# Patient Record
Sex: Female | Born: 1970 | Race: Asian | Hispanic: No | Marital: Single | Smoking: Never smoker
Health system: Southern US, Community
[De-identification: ages and names within clinical notes are randomized; demographics above are authoritative.]

## PROBLEM LIST (undated history)

## (undated) DIAGNOSIS — R21 Rash and other nonspecific skin eruption: Secondary | ICD-10-CM

## (undated) DIAGNOSIS — T7840XA Allergy, unspecified, initial encounter: Secondary | ICD-10-CM

## (undated) DIAGNOSIS — Z789 Other specified health status: Secondary | ICD-10-CM

## (undated) DIAGNOSIS — E785 Hyperlipidemia, unspecified: Secondary | ICD-10-CM

## (undated) DIAGNOSIS — U071 COVID-19: Secondary | ICD-10-CM

## (undated) HISTORY — DX: Other specified health status: Z78.9

## (undated) HISTORY — DX: Rash and other nonspecific skin eruption: R21

## (undated) HISTORY — DX: Hyperlipidemia, unspecified: E78.5

## (undated) HISTORY — DX: COVID-19: U07.1

## (undated) HISTORY — PX: TUBAL LIGATION: SHX77

## (undated) HISTORY — DX: Allergy, unspecified, initial encounter: T78.40XA

## (undated) HISTORY — PX: OOPHORECTOMY: SHX86

---

## 2018-01-09 ENCOUNTER — Ambulatory Visit: Payer: Medicaid Other | Admitting: Obstetrics and Gynecology

## 2018-01-09 ENCOUNTER — Encounter: Payer: Self-pay | Admitting: Obstetrics and Gynecology

## 2018-01-09 VITALS — BP 103/55 | HR 72 | Wt 127.0 lb

## 2018-01-09 DIAGNOSIS — N898 Other specified noninflammatory disorders of vagina: Secondary | ICD-10-CM | POA: Diagnosis not present

## 2018-01-09 DIAGNOSIS — R21 Rash and other nonspecific skin eruption: Secondary | ICD-10-CM | POA: Diagnosis not present

## 2018-01-09 DIAGNOSIS — B3731 Acute candidiasis of vulva and vagina: Secondary | ICD-10-CM

## 2018-01-09 DIAGNOSIS — B373 Candidiasis of vulva and vagina: Secondary | ICD-10-CM

## 2018-01-09 MED ORDER — MICONAZOLE NITRATE 4 % VA CREA: TOPICAL_CREAM | VAGINAL | 2 refills | Status: DC

## 2018-01-09 MED ORDER — FLUCONAZOLE 150 MG PO TABS: ORAL_TABLET | ORAL | 0 refills | Status: DC

## 2018-01-09 NOTE — Progress Notes (Signed)
Saw provider at [facility] couple wks ago. Treated for BV which is better. Also given Nystatin for vaginal itching which helped a few days and then got worse. Now no vag d/c. Was tested for sti and all negative. Having a lot of vag itching and perineal itching and dryness

## 2018-01-09 NOTE — Patient Instructions (Signed)

## 2018-01-09 NOTE — Progress Notes (Signed)
   GYNECOLOGY OFFICE VISIT NOTE  History:  47 y.o. No obstetric history on file. here today for concern about vaginal itching. Patient states she has had vaginal dryness and itching for several weeks. Has associated rash with it. States she went to go see her PCP for concernc and was dfiangosed with BV which was treated. Also given Nystain for itching but aptietn discontinued because she states it didn't work for symtpom relief. Patient states she gets releif from using cocnut oil. She has stopepd shaving and waxing the area. Tried steriods without improvement. Physician alst tried given hydroxyzine for itching but it is not helping. Ithcing and discomfort worse when wet. Referred for consultation.  She denies any abnormal vaginal discharge, bleeding, pelvic pain or other concerns.   History reviewed. No pertinent past medical history.  History reviewed. No pertinent surgical history.  The following portions of the patient's history were reviewed and updated as appropriate: allergies, current medications, past family history, past medical history, past social history, past surgical history and problem list.   Health Maintenance:  Needs annual wellness to include pap and breast exam. Needs mammography.   Review of Systems:  Pertinent items noted in HPI.   Objective:  Physical Exam BP (!) 103/55   Pulse 72   Wt 57.6 kg (127 lb)  CONSTITUTIONAL: Well-developed, well-nourished female in no acute distress.  NEUROLOGIC: Alert and oriented. PSYCHIATRIC: Normal mood and affect.  CARDIOVASCULAR: Normal heart rate noted RESPIRATORY: Effort normal, no problems with respiration noted ABDOMEN: Soft, non-tender. no distention noted.   PELVIC: deep red rash with scaling over entire vulva area and into rectum. Excoriations appreciated. Appears thickened and lichenified. No signs of superimposed infection MUSCULOSKELETAL: Normal range of motion. No edema noted.  Labs and Imaging No results  found.  Assessment & Plan:  1. Rash of vulva Most consistent with vulvovaginal candidiasis ina appearance and symptomatology. Will treat with oral fluconazole and topical miconazole. Continue Atarax for itching. Patient to return in 2 weeks for follow-up on rash. Alternate diagnosis could be lichen sclerosis but less likely.   Routine preventative health maintenance measures emphasized. Patient to schedule seperate annual visit.  Please refer to After Visit Summary for other counseling recommendations.   Return in about 2 weeks (around 01/23/2018) for gyn follow-up.  Caryl Ada, DO OB Fellow Center for [facility], [facility]

## 2018-01-10 ENCOUNTER — Encounter: Payer: Self-pay | Admitting: Advanced Practice Midwife

## 2018-01-16 ENCOUNTER — Other Ambulatory Visit (HOSPITAL_COMMUNITY)
Admission: RE | Admit: 2018-01-16 | Discharge: 2018-01-16 | Disposition: A | Discharge status: Home / Self Care | Payer: Medicaid Other | Source: Ambulatory Visit | Attending: Family Medicine | Admitting: Family Medicine

## 2018-01-16 ENCOUNTER — Ambulatory Visit: Payer: Medicaid Other | Admitting: Advanced Practice Midwife

## 2018-01-16 ENCOUNTER — Encounter: Payer: Self-pay | Admitting: Advanced Practice Midwife

## 2018-01-16 VITALS — BP 98/72 | HR 86 | Wt 128.6 lb

## 2018-01-16 DIAGNOSIS — L308 Other specified dermatitis: Secondary | ICD-10-CM | POA: Insufficient documentation

## 2018-01-16 DIAGNOSIS — L292 Pruritus vulvae: Secondary | ICD-10-CM | POA: Insufficient documentation

## 2018-01-16 DIAGNOSIS — N9089 Other specified noninflammatory disorders of vulva and perineum: Secondary | ICD-10-CM | POA: Diagnosis present

## 2018-01-16 DIAGNOSIS — R21 Rash and other nonspecific skin eruption: Secondary | ICD-10-CM

## 2018-01-16 DIAGNOSIS — N898 Other specified noninflammatory disorders of vagina: Secondary | ICD-10-CM

## 2018-01-16 MED ORDER — CLOBETASOL PROP EMOLLIENT BASE 0.05 % EX CREA: 1.0000 "application " | TOPICAL_CREAM | CUTANEOUS | 5 refills | Status: DC

## 2018-01-16 NOTE — Progress Notes (Signed)
Procedure: Asked to see patient by Sharen Counter, CNM. There is diffuse lichenification noted of the labia majora/peri-rectal area and vulva with labia minora sparing. Area is pink/gray with scaling. New areas are cropping up. S/p multiple failed treatments for yeast. Patient identified, informed consent signed, copy in chart, time out performed.    Area cleansed with Alcohol.  Injected with 1% Lidocaine with Epi.  2 mL. Area cleaned with Betadine and 4 mm punch biopsy performed without difficulty.  Hemostasis obtained with Silver Nitrate.  Patient tolerated procedure well.   Patient given post procedure instructions.   Reva Bores, MD 01/16/2018 5:48 PM

## 2018-01-16 NOTE — Progress Notes (Signed)
  GYNECOLOGY PROGRESS NOTE  History:  47 y.o. presents to [facility] office today for problem gyn visit. She reports worsening vaginal symptoms including itching, skin flaking and dryness, and discoloration of the vaginal area.  She reports symptoms started ~1 month ago.  She is taking Diflucan 150 mg Q 3 days x 3 doses and using topical Monistat daily for her symptoms but there is worsening instead of improvement.  She has never had these symptoms before. There are no other associated symptoms.  She denies h/a, dizziness, shortness of breath, n/v, or fever/chills.    The following portions of the patient's history were reviewed and updated as appropriate: allergies, current medications, past family history, past medical history, past social history, past surgical history and problem list.   Review of Systems:  Pertinent items are noted in HPI.   Objective:  Physical Exam Blood pressure 98/72, pulse 86, weight 128 lb 9.6 oz (58.3 kg). VS reviewed, nursing note reviewed,  Constitutional: well developed, well nourished, no distress HEENT: normocephalic CV: normal rate Pulm/chest wall: normal effort Breast Exam: deferred Abdomen: soft Neuro: alert and oriented x 3 Skin: warm, dry Psych: affect normal Pelvic exam: On visual inspection, bilateral labia, perineum, and rectal skin areas are pale/gray, flaky with mild edema, erythema at site and with small satellite lesions surrounding, some small open skin areas (scratching?) throughout    Assessment & Plan:  1. Vaginal itching --Consult Dr Shawnie Pons regarding appearance and presentation.   --Biopsy by Dr Shawnie Pons, see separate note - Surgical pathology  2. Rash of vulva  - Clobetasol Prop Emollient Base (CLOBETASOL PROPIONATE E) 0.05 % emollient cream; Apply 1 application topically 1 day or 1 dose. Use tid x 3-4 wks, then drop to bid x 3-4 wks, then drop to daily x 3-4 wks, then drop to qod  Dispense: 60 g; Refill: 5   Sharen Counter,  CNM 5:49 PM

## 2018-01-16 NOTE — Patient Instructions (Signed)
Vulva Biopsy, Care After These instructions give you information about caring for yourself after your procedure. Your doctor may also give you more specific instructions. Call your doctor if you have any problems or questions after your procedure. Follow these instructions at home: Biopsy Site Care   Do not rub the biopsy area after peeing (urinating). Gently: ? Pat the area dry. Or, use a bottle filled with warm water (peri-bottle) to clean the area. ? Wipe from front to back.  Follow instructions from your doctor about how to take care of your biopsy site. Make sure you: ? Clean the area using water and mild soap twice a day or as told by your doctor. Gently pat the area dry. ? If you were prescribed an antibiotic medical ointment, apply it as told by your doctor. Do not stop using the antibiotic even if your condition gets better. ? Take a warm water bath that is taken while you are sitting down (sitz bath). Do this as needed to help with pain. ? Leave stitches (sutures), skin glue, or skin tape (adhesive) strips in place. They may need to stay in place for 2 weeks or longer. If tape strips get loose and curl up, you may trim the loose edges. Do not remove tape strips completely unless your doctor says it is okay.  Check your biopsy site every day for signs of infection. Check for: ? More redness, swelling, or pain. ? More fluid or blood. ? Warmth. ? Pus or a bad smell. Lifestyle  Wear loose, cotton underwear.  Do not wear tight pants.  Do not use a tampon, douche, or put anything in your vagina for at least one week or until your doctor says it is okay.  Do not have sex for at least one week or until your doctor says it is okay.  Do not exercise until your doctor says it is okay.  Do not take baths, swim, or use a hot tub until your doctor says it is okay. General instructions  Take over-the-counter and prescription medicines only as told by your doctor.  Use a sanitary pad  until bleeding stops.  Keep all follow-up visits as told by your doctor. This is important.  If the sample is being sent for testing, it is your responsibility to get the results of your procedure. Ask your doctor or the department doing the procedure when your results will be ready. Contact a doctor if:  You have more redness, swelling, or pain around your biopsy site.  You have more fluid or blood coming from your biopsy site.  Your biopsy site feels warm when you touch it.  Medicine does not help your pain. Get help right away if:  You have a lot of bleeding from the vulva.  You have pus or a bad smell coming from your biopsy site.  You have a fever.  You have lower belly pain. This information is not intended to replace advice given to you by your health care provider. Make sure you discuss any questions you have with your health care provider. Document Released: 11/26/2008 Document Revised: 02/05/2016 Document Reviewed: 07/21/2015 Elsevier Interactive Patient Education  2018 Elsevier Inc.  

## 2018-01-16 NOTE — Progress Notes (Signed)
Was seen last wk in clinic for vaginal yeast infection and rash on perineum. Treated for yeast and taking Keflex but not any better-poss worse. Unable to sleep and very uncomfortable

## 2018-01-17 ENCOUNTER — Encounter: Payer: Self-pay | Admitting: *Deleted

## 2018-01-19 ENCOUNTER — Encounter (HOSPITAL_COMMUNITY): Payer: Self-pay | Admitting: *Deleted

## 2018-01-19 ENCOUNTER — Inpatient Hospital Stay (HOSPITAL_COMMUNITY)
Admission: AD | Admit: 2018-01-19 | Discharge: 2018-01-19 | Disposition: A | Discharge status: Home / Self Care | Payer: Medicaid Other | Source: Ambulatory Visit | Attending: Obstetrics & Gynecology | Admitting: Obstetrics & Gynecology

## 2018-01-19 ENCOUNTER — Other Ambulatory Visit: Payer: Self-pay | Admitting: Advanced Practice Midwife

## 2018-01-19 DIAGNOSIS — T8149XA Infection following a procedure, other surgical site, initial encounter: Secondary | ICD-10-CM

## 2018-01-19 DIAGNOSIS — N898 Other specified noninflammatory disorders of vagina: Secondary | ICD-10-CM | POA: Insufficient documentation

## 2018-01-19 DIAGNOSIS — Z79899 Other long term (current) drug therapy: Secondary | ICD-10-CM | POA: Diagnosis not present

## 2018-01-19 DIAGNOSIS — R102 Pelvic and perineal pain: Secondary | ICD-10-CM | POA: Insufficient documentation

## 2018-01-19 DIAGNOSIS — L292 Pruritus vulvae: Secondary | ICD-10-CM

## 2018-01-19 MED ORDER — COCONUT OIL OIL
1.0000 "application " | TOPICAL_OIL | Status: DC | PRN
  Administered 2018-01-19: 1 via TOPICAL
  Filled 2018-01-19 (×2): qty 120

## 2018-01-19 MED ORDER — SULFAMETHOXAZOLE-TRIMETHOPRIM 800-160 MG PO TABS: 1.0000 | ORAL_TABLET | Freq: Two times a day (BID) | ORAL | 0 refills | Status: AC

## 2018-01-19 MED ORDER — NAPROXEN 500 MG PO TABS: 500.0000 mg | ORAL_TABLET | Freq: Two times a day (BID) | ORAL | 0 refills | Status: DC

## 2018-01-19 NOTE — MAU Provider Note (Signed)
Chief Complaint: Vaginal Pain   First Provider Initiated Contact with Patient 01/19/18 1141      SUBJECTIVE HPI: Elaine Hawkins is a 47 y.o. G2P2002 who presents to maternity admissions reporting worsening vaginal symptoms with vaginal pain.  She initially presented on 4/29 to [facility] office and was treated for yeast infection.  On 01/16/18 she presented with worsening vaginal itching, and some discoloration and peeling/dryness of the vagina. A vaginal punch biopsy was taken at that time by Dr Shawnie Pons and she was started on topical steroid ointment, clobetasol TID PRN.  She reports the vulva, perineum, and rectal area are now painful, like pins and needles, and the are of the biopsy is very painful and draining yellow liquid.  She has not tried any other treatments. There are no other associated symptoms.   She denies vaginal bleeding, vaginal itching/burning, urinary symptoms, h/a, dizziness, n/v, or fever/chills.     HPI  Past Medical History:  Diagnosis Date  . Rash    Past Surgical History:  Procedure Laterality Date  . CESAREAN SECTION    . OOPHORECTOMY     L ovary   Social History   Socioeconomic History  . Marital status: Single    Spouse name: Not on file  . Number of children: Not on file  . Years of education: Not on file  . Highest education level: Not on file  Occupational History  . Not on file  Social Needs  . Financial resource strain: Not on file  . Food insecurity:    Worry: Not on file    Inability: Not on file  . Transportation needs:    Medical: Not on file    Non-medical: Not on file  Tobacco Use  . Smoking status: Never Smoker  . Smokeless tobacco: Never Used  Substance and Sexual Activity  . Alcohol use: Never    Frequency: Never  . Drug use: Never  . Sexual activity: Yes  Lifestyle  . Physical activity:    Days per week: Not on file    Minutes per session: Not on file  . Stress: Not on file  Relationships  . Social connections:    Talks on phone:  Not on file    Gets together: Not on file    Attends religious service: Not on file    Active member of club or organization: Not on file    Attends meetings of clubs or organizations: Not on file    Relationship status: Not on file  . Intimate partner violence:    Fear of current or ex partner: Not on file    Emotionally abused: Not on file    Physically abused: Not on file    Forced sexual activity: Not on file  Other Topics Concern  . Not on file  Social History Narrative  . Not on file   No current facility-administered medications on file prior to encounter.    Current Outpatient Medications on File Prior to Encounter  Medication Sig Dispense Refill  . Clobetasol Prop Emollient Base (CLOBETASOL PROPIONATE E) 0.05 % emollient cream Apply 1 application topically 1 day or 1 dose. Use tid x 3-4 wks, then drop to bid x 3-4 wks, then drop to daily x 3-4 wks, then drop to qod 60 g 5  . fluconazole (DIFLUCAN) 150 MG tablet Take one tablet. Wait 3 days then take second dose. Wait another 3 days and take final third dose. 3 tablet 0   No Known Allergies  ROS:  Review of Systems  Constitutional: Negative for chills, fatigue and fever.  Respiratory: Negative for shortness of breath.   Cardiovascular: Negative for chest pain.  Genitourinary: Positive for vaginal pain. Negative for difficulty urinating, dysuria, flank pain, pelvic pain, vaginal bleeding and vaginal discharge.  Neurological: Negative for dizziness and headaches.  Psychiatric/Behavioral: Negative.      I have reviewed patient's Past Medical Hx, Surgical Hx, Family Hx, Social Hx, medications and allergies.   Physical Exam   Patient Vitals for the past 24 hrs:  BP Temp Temp src Pulse Resp SpO2 Height Weight  01/19/18 1404 116/70 97.9 F (36.6 C) Oral 72 16 - - -  01/19/18 1110 108/72 98.3 F (36.8 C) Oral 83 18 100 %  (1.549 m) 126 lb (57.2 kg)   Constitutional: Well-developed, well-nourished female in no acute  distress.  Cardiovascular: normal rate Respiratory: normal effort GI: Abd soft, non-tender. Pos BS x 4 MS: Extremities nontender, no edema, normal ROM Neurologic: Alert and oriented x 4.  GU: Neg CVAT.  PELVIC EXAM: On visual inspection, bilateral labia, perineum, and rectal area with mild erythema and peeling skin in patches    LAB RESULTS No results found for this or any previous visit (from the past 24 hour(s)).     IMAGING No results found.  MAU Management/MDM: On visual inspection, there is improvement over my exam on 5/6 in the office.  There is more dryness and peeling, possibly healing from previous boggy/edemetous tissue.  Pins and needles sensation may also be healing.  Biopsy wound does have some edema surrounding it and some yellow discharge.  Dr Macon Large to bedside to evaluate pt.  Continue clobetasol as prescribed, may reduce to BID if preferred.  Topical coconut oil obtained for pt to apply for comfort. Use as often as needed PRN in between medication doses.  Start Bactrim DS BID x 7 days for possible wound/surgical site infection.  Naproxen 500 mg BID PRN for pain. F/U in office as scheduled. Pt discharged with strict return precautions.  ASSESSMENT 1. Wound, surgical, infected   2. Vulvar itching     PLAN Discharge home Allergies as of 01/19/2018   No Known Allergies     Medication List    STOP taking these medications   cephALEXin 500 MG capsule Commonly known as:  KEFLEX   MICONAZOLE NITRATE VAGINAL 4 % Crea     TAKE these medications   Clobetasol Prop Emollient Base 0.05 % emollient cream Commonly known as:  CLOBETASOL PROPIONATE E Apply 1 application topically 1 day or 1 dose. Use tid x 3-4 wks, then drop to bid x 3-4 wks, then drop to daily x 3-4 wks, then drop to qod   fluconazole 150 MG tablet Commonly known as:  DIFLUCAN Take one tablet. Wait 3 days then take second dose. Wait another 3 days and take final third dose.    sulfamethoxazole-trimethoprim 800-160 MG tablet Commonly known as:  BACTRIM DS,SEPTRA DS Take 1 tablet by mouth 2 (two) times daily for 7 days.      Follow-up Information    Center for [facility] Follow up.   Specialty:  Obstetrics and Gynecology Why:  As scheduled, return to MAU as needed for emergencies Contact information: [address] [place] [address] [phone]          Sharen Counter Certified Nurse-Midwife 01/19/2018  6:15 PM

## 2018-01-19 NOTE — MAU Note (Signed)
Pt reports vaginal irritation, was seen and given cream and referred to gyn clinic and given diflucan, another cream and they did a biopsy. States the symptoms are worsening.

## 2018-01-19 NOTE — Discharge Instructions (Signed)
Use topical steroid as prescribed plus use coconut oil for hydration in between uses of medication.

## 2018-01-19 NOTE — Progress Notes (Signed)
See complete MAU  note

## 2018-01-21 LAB — HERPES SIMPLEX VIRUS(HSV) DNA BY PCR
HSV 1 DNA: NEGATIVE
HSV 2 DNA: NEGATIVE

## 2018-01-24 ENCOUNTER — Ambulatory Visit (INDEPENDENT_AMBULATORY_CARE_PROVIDER_SITE_OTHER): Payer: Medicaid Other | Admitting: Obstetrics and Gynecology

## 2018-01-24 ENCOUNTER — Encounter: Payer: Self-pay | Admitting: Obstetrics and Gynecology

## 2018-01-24 VITALS — BP 110/65 | HR 86 | Wt 130.1 lb

## 2018-01-24 DIAGNOSIS — L292 Pruritus vulvae: Secondary | ICD-10-CM | POA: Diagnosis not present

## 2018-01-25 ENCOUNTER — Other Ambulatory Visit: Payer: Self-pay | Admitting: Family Medicine

## 2018-01-25 DIAGNOSIS — L292 Pruritus vulvae: Secondary | ICD-10-CM

## 2018-01-26 ENCOUNTER — Other Ambulatory Visit (HOSPITAL_COMMUNITY): Payer: Self-pay | Admitting: Family

## 2018-01-26 DIAGNOSIS — Z1231 Encounter for screening mammogram for malignant neoplasm of breast: Secondary | ICD-10-CM

## 2018-01-27 ENCOUNTER — Ambulatory Visit
Admission: RE | Admit: 2018-01-27 | Discharge: 2018-01-27 | Disposition: A | Discharge status: Home / Self Care | Payer: Medicaid Other | Source: Ambulatory Visit | Attending: Family | Admitting: Family

## 2018-01-27 ENCOUNTER — Encounter: Payer: Self-pay | Admitting: Obstetrics and Gynecology

## 2018-01-27 DIAGNOSIS — Z1231 Encounter for screening mammogram for malignant neoplasm of breast: Secondary | ICD-10-CM

## 2018-01-27 NOTE — Progress Notes (Signed)
   GYNECOLOGY OFFICE VISIT NOTE  History:  47 y.o. No obstetric history on file. here today for follow-up on vulvar rash. Patient states after last appointment with me she had to follow-up twice once being in MAU. Stated the treatment for yeast did not work. Came in to clinic and had biopsy of area and was given steroid cream to use TID. The cream made her dry and was painful so she went to MAU. While in the MAU noted that biopsy site was infected. Was given Rx for antibiotics for which she is completing. States that now that she has reduced the frequency of the clobetasol ointment to daily the rash is much better. Itching has improved. Coloration also improving. Feels much better. Wondering what her biopsy results showed. She denies any abnormal vaginal discharge, bleeding, pelvic pain, dyusira or other concerns.   Past Medical History:  Diagnosis Date  . Rash     Past Surgical History:  Procedure Laterality Date  . CESAREAN SECTION    . OOPHORECTOMY     L ovary   Current Outpatient Medications on File Prior to Visit  Medication Sig Dispense Refill  . Clobetasol Prop Emollient Base (CLOBETASOL PROPIONATE E) 0.05 % emollient cream Apply 1 application topically 1 day or 1 dose. Use tid x 3-4 wks, then drop to bid x 3-4 wks, then drop to daily x 3-4 wks, then drop to qod 60 g 5  . fluconazole (DIFLUCAN) 150 MG tablet Take one tablet. Wait 3 days then take second dose. Wait another 3 days and take final third dose. 3 tablet 0  . naproxen (NAPROSYN) 500 MG tablet Take 1 tablet (500 mg total) by mouth 2 (two) times daily with a meal. 30 tablet 0   No current facility-administered medications on file prior to visit.     Review of Systems:  Pertinent items noted in HPI.   Objective:  Physical Exam BP 110/65   Pulse 86   Wt 130 lb 1.6 oz (59 kg)   LMP 12/29/2017   BMI 24.58 kg/m  CONSTITUTIONAL: Well-developed, well-nourished female in no acute distress.  NEUROLOGIC: Alert and  oriented. PSYCHIATRIC: Normal mood and affect.  CARDIOVASCULAR: Normal heart rate noted RESPIRATORY: Effort normal, no problems with respiration noted PELVIC: on visual inspection bilateral labia, perineum, and rectum with thickened pink skin. Improved lichenification. No overlying scales. Biopsy site clean without drainage.   Labs and Imaging None  Assessment & Plan:  1. Rash of vulva Much improved. Picture from today placed in chart. No prior pictures to compare. Patient to reduce clobetasol to prn. She feels much better. No further concerns today. Biopsy results given and reviewed.     Return for annual pe.  Caryl Ada, DO OB Fellow Center for [facility], [facility]

## 2018-01-30 ENCOUNTER — Encounter: Payer: Self-pay | Admitting: *Deleted

## 2018-02-03 ENCOUNTER — Telehealth: Payer: Self-pay

## 2018-02-03 NOTE — Telephone Encounter (Signed)
-----   Message from Reva Bores, MD sent at 01/25/2018  3:52 PM EDT ----- Please refer pt. To derm. Order placed.

## 2018-02-07 ENCOUNTER — Other Ambulatory Visit (HOSPITAL_COMMUNITY)
Admission: RE | Admit: 2018-02-07 | Discharge: 2018-02-07 | Disposition: A | Discharge status: Home / Self Care | Payer: Medicaid Other | Source: Ambulatory Visit | Attending: Obstetrics and Gynecology | Admitting: Obstetrics and Gynecology

## 2018-02-07 ENCOUNTER — Ambulatory Visit (INDEPENDENT_AMBULATORY_CARE_PROVIDER_SITE_OTHER): Payer: Medicaid Other | Admitting: Obstetrics and Gynecology

## 2018-02-07 ENCOUNTER — Encounter: Payer: Self-pay | Admitting: Obstetrics and Gynecology

## 2018-02-07 VITALS — BP 107/66 | HR 88 | Ht 61.0 in | Wt 127.5 lb

## 2018-02-07 DIAGNOSIS — L292 Pruritus vulvae: Secondary | ICD-10-CM

## 2018-02-07 DIAGNOSIS — Z0000 Encounter for general adult medical examination without abnormal findings: Secondary | ICD-10-CM

## 2018-02-07 DIAGNOSIS — Z01419 Encounter for gynecological examination (general) (routine) without abnormal findings: Secondary | ICD-10-CM

## 2018-02-07 NOTE — Patient Instructions (Signed)

## 2018-02-08 ENCOUNTER — Telehealth: Payer: Self-pay | Admitting: General Practice

## 2018-02-08 ENCOUNTER — Encounter: Payer: Self-pay | Admitting: Obstetrics and Gynecology

## 2018-02-08 MED ORDER — CERAVE EX OINT: 1.0000 "application " | TOPICAL_OINTMENT | Freq: Every day | CUTANEOUS | 1 refills | Status: DC | PRN

## 2018-02-08 NOTE — Telephone Encounter (Signed)
Patient called and left message on nurse voicemail line stating Dr Doroteo Glassman prescribed her an ointment and it isn't at her [employer]. Called patient and discussed that I would send a message to Dr Doroteo Glassman to prescribe that medication for her. Patient verbalized understanding & had no questions.

## 2018-02-08 NOTE — Telephone Encounter (Signed)
Ointment sent to pharmacy

## 2018-02-08 NOTE — Progress Notes (Signed)
GYNECOLOGY OFFICE VISIT NOTE  History:  47 y.o. [id number] here today for annual exam.   Vulvar rash: Has followed with dermatology. Did not get any new answers. Was prescribed a steroid cream. Will be getting a second opinion. States rash still present and itches. Wants suggestion on other things she can use besides steroids.  She denies any abnormal vaginal discharge, bleeding, pelvic pain or other concerns.   Past Medical History:  Diagnosis Date  . Medical history non-contributory   . Rash     Past Surgical History:  Procedure Laterality Date  . CESAREAN SECTION    . OOPHORECTOMY     L ovary  . TUBAL LIGATION     Current Outpatient Medications on File Prior to Visit  Medication Sig Dispense Refill  . calcium-vitamin D (OSCAL WITH D) 250-125 MG-UNIT tablet Take 1 tablet by mouth daily.    . Clobetasol Prop Emollient Base (CLOBETASOL PROPIONATE E) 0.05 % emollient cream Apply 1 application topically 1 day or 1 dose. Use tid x 3-4 wks, then drop to bid x 3-4 wks, then drop to daily x 3-4 wks, then drop to qod 60 g 5   No current facility-administered medications on file prior to visit.    The following portions of the patient's history were reviewed and updated as appropriate: allergies, current medications, past family history, past medical history, past social history, past surgical history and problem list.   Health Maintenance:  Pap smear due. Normal mammogram on 01/27/18.   Review of Systems:  Pertinent items noted in HPI and remainder of comprehensive ROS otherwise negative.   Objective:  Physical Exam BP 107/66   Pulse 88   Ht  (1.549 m)   Wt 127 lb 8 oz (57.8 kg)   LMP 01/22/2018   BMI 24.09 kg/m  CONSTITUTIONAL: Well-developed, well-nourished female in no acute distress.  HENT:  Normocephalic, atraumatic. External right and left ear normal. Oropharynx is clear and moist EYES: Conjunctivae and EOM are normal. Pupils are equal, round, and reactive to light.  No scleral icterus.  NECK: Normal range of motion, supple, no masses SKIN: Skin is warm and dry. No rash noted. Not diaphoretic. No erythema. No pallor. NEUROLOGIC: Alert and oriented to person, place, and time. Normal reflexes, muscle tone coordination. No cranial nerve deficit noted. PSYCHIATRIC: Normal mood and affect. Normal behavior. Normal judgment and thought content. CARDIOVASCULAR: Normal heart rate noted RESPIRATORY: Effort and breath sounds normal, no problems with respiration noted ABDOMEN: Soft, no distention noted.   Breasts: breasts appear normal, no suspicious masses, no skin or nipple changes or axillary nodes. PELVIC: Normal appearing external genitalia with hyperpigmented vulva rash; normal appearing vaginal mucosa and cervix.  No abnormal discharge noted.  Normal uterine size, no other palpable masses, no uterine or adnexal tenderness. MUSCULOSKELETAL: Normal range of motion. No edema noted.  Labs and Imaging Mm 3d Screen Breast Bilateral  Result Date: 01/27/2018 CLINICAL DATA:  Screening. EXAM: DIGITAL SCREENING BILATERAL MAMMOGRAM WITH TOMO AND CAD COMPARISON:  None. ACR Breast Density Category c: The breast tissue is heterogeneously dense, which may obscure small masses FINDINGS: There are no findings suspicious for malignancy. Images were processed with CAD. IMPRESSION: No mammographic evidence of malignancy. A result letter of this screening mammogram will be mailed directly to the patient. RECOMMENDATION: Screening mammogram in one year. (Code:SM-B-01Y) BI-RADS CATEGORY  1: Negative. Electronically Signed   By: Gerome Sam III M.D   On: 01/27/2018 15:55    Assessment & Plan:  1. Annual physical exam Pap performed today. Routine preventative health maintenance measures emphasized. - Cytology - PAP  2. Vulvar itching Follow-up with dermatology. Emollient sent to pharmacy to help with dryness and itching. Use steroids prn for flares.   Please refer to After Visit  Summary for other counseling recommendations.    Total face-to-face time with patient: 25 minutes. Over 50% of encounter was spent on counseling and coordination of care.  Caryl Ada, DO OB Fellow Center for [facility], [facility]

## 2018-02-08 NOTE — Addendum Note (Signed)
Addended by: Pincus Large on: 02/08/2018 07:30 PM   Modules accepted: Orders

## 2018-02-10 LAB — CYTOLOGY - PAP
Chlamydia: NEGATIVE
Diagnosis: NEGATIVE
HPV: NOT DETECTED
Neisseria Gonorrhea: NEGATIVE

## 2018-02-20 NOTE — Telephone Encounter (Signed)
[facility] October 3rd @ 0830.  Called pt and pt informed me that she has [facility].  I informed pt that I will call and cancel that [facility].

## 2018-07-14 ENCOUNTER — Ambulatory Visit (HOSPITAL_COMMUNITY)
Admission: RE | Admit: 2018-07-14 | Discharge: 2018-07-14 | Disposition: A | Discharge status: Home / Self Care | Payer: Medicaid Other | Source: Ambulatory Visit | Attending: Family Medicine | Admitting: Family Medicine

## 2018-07-14 ENCOUNTER — Other Ambulatory Visit (HOSPITAL_COMMUNITY): Payer: Self-pay | Admitting: Family Medicine

## 2018-07-14 DIAGNOSIS — R52 Pain, unspecified: Secondary | ICD-10-CM

## 2018-07-14 DIAGNOSIS — M25522 Pain in left elbow: Secondary | ICD-10-CM | POA: Insufficient documentation

## 2018-09-20 ENCOUNTER — Emergency Department (HOSPITAL_COMMUNITY)
Admission: EM | Admit: 2018-09-20 | Discharge: 2018-09-20 | Disposition: A | Discharge status: Home / Self Care | Payer: Medicaid Other | Attending: Emergency Medicine | Admitting: Emergency Medicine

## 2018-09-20 ENCOUNTER — Encounter (HOSPITAL_COMMUNITY): Payer: Self-pay | Admitting: Emergency Medicine

## 2018-09-20 DIAGNOSIS — Z79899 Other long term (current) drug therapy: Secondary | ICD-10-CM | POA: Diagnosis not present

## 2018-09-20 DIAGNOSIS — H02846 Edema of left eye, unspecified eyelid: Secondary | ICD-10-CM | POA: Diagnosis present

## 2018-09-20 DIAGNOSIS — R220 Localized swelling, mass and lump, head: Secondary | ICD-10-CM

## 2018-09-20 MED ORDER — IBUPROFEN 200 MG PO TABS
600.0000 mg | ORAL_TABLET | Freq: Once | ORAL | Status: AC
  Administered 2018-09-20: 600 mg via ORAL
  Filled 2018-09-20: qty 3

## 2018-09-20 MED ORDER — CLINDAMYCIN HCL 150 MG PO CAPS: 300.0000 mg | ORAL_CAPSULE | Freq: Three times a day (TID) | ORAL | 0 refills | Status: AC

## 2018-09-20 NOTE — Discharge Instructions (Addendum)
You have been seen today for facial swelling. Please read and follow all provided instructions.   1. Medications: Clindamycin (antibiotic), usual home medications 2. Treatment: rest, drink plenty of fluids, apply warm compresses to eye, wash hands frequently 3. Follow Up: Please follow up with your primary doctor in 2 days for discussion of your diagnoses and further evaluation after today's visit; if you do not have a primary care doctor use the resource guide provided to find one; Please return to the ER for any new or worsening symptoms. Please obtain all of your results from medical records or have your doctors office obtain the results - share them with your doctor - you should be seen at your doctors office. Call today to arrange your follow up.   Take medications as prescribed. Please review all of the medicines and only take them if you do not have an allergy to them. Return to the emergency room for worsening condition or new concerning symptoms. Follow up with your regular doctor. If you don't have a regular doctor use one of the numbers below to establish a primary care doctor.  Please be aware that if you are taking birth control pills, taking other prescriptions, ESPECIALLY ANTIBIOTICS may make the birth control ineffective - if this is the case, either do not engage in sexual activity or use alternative methods of birth control such as condoms until you have finished the medicine and your family doctor says it is OK to restart them. If you are on a blood thinner such as COUMADIN, be aware that any other medicine that you take may cause the coumadin to either work too much, or not enough - you should have your coumadin level rechecked in next 7 days if this is the case.  ?  It is also a possibility that you have an allergic reaction to any of the medicines that you have been prescribed - Everybody reacts differently to medications and while MOST people have no trouble with most medicines, you  may have a reaction such as nausea, vomiting, rash, swelling, shortness of breath. If this is the case, please stop taking the medicine immediately and contact your physician.  ?  You should return to the ER if you develop severe or worsening symptoms.   Emergency Department Resource Guide 1) Find a Doctor and Pay Out of Pocket Although you won't have to find out who is covered by your insurance plan, it is a good idea to ask around and get recommendations. You will then need to call the office and see if the doctor you have chosen will accept you as a new patient and what types of options they offer for patients who are self-pay. Some doctors offer discounts or will set up payment plans for their patients who do not have insurance, but you will need to ask so you aren't surprised when you get to your appointment.  2) Contact Your Local Health Department Not all health departments have doctors that can see patients for sick visits, but many do, so it is worth a call to see if yours does. If you don't know where your local health department is, you can check in your phone book. The CDC also has a tool to help you locate your state's health department, and many state websites also have listings of all of their local health departments.  3) Find a Walk-in Clinic If your illness is not likely to be very severe or complicated, you may want to try  a walk in clinic. These are popping up all over the country in pharmacies, drugstores, and shopping centers. They're usually staffed by nurse practitioners or physician assistants that have been trained to treat common illnesses and complaints. They're usually fairly quick and inexpensive. However, if you have serious medical issues or chronic medical problems, these are probably not your best option.  No Primary Care Doctor: Call Health Connect at  [phone] - they can help you locate a primary care doctor that  accepts your insurance, provides certain services,  etc. Physician Referral [phone]  Emergency Department Resource Guide 1) Find a Doctor and Pay Out of Pocket Although you won't have to find out who is covered by your insurance plan, it is a good idea to ask around and get recommendations. You will then need to call the office and see if the doctor you have chosen will accept you as a new patient and what types of options they offer for patients who are self-pay. Some doctors offer discounts or will set up payment plans for their patients who do not have insurance, but you will need to ask so you aren't surprised when you get to your appointment.  2) Contact Your Local Health Department Not all health departments have doctors that can see patients for sick visits, but many do, so it is worth a call to see if yours does. If you don't know where your local health department is, you can check in your phone book. The CDC also has a tool to help you locate your state's health department, and many state websites also have listings of all of their local health departments.  3) Find a Walk-in Clinic If your illness is not likely to be very severe or complicated, you may want to try a walk in clinic. These are popping up all over the country in pharmacies, drugstores, and shopping centers. They're usually staffed by nurse practitioners or physician assistants that have been trained to treat common illnesses and complaints. They're usually fairly quick and inexpensive. However, if you have serious medical issues or chronic medical problems, these are probably not your best option.  No Primary Care Doctor: Call Health Connect at  [phone] - they can help you locate a primary care doctor that  accepts your insurance, provides certain services, etc. Physician Referral Service- [phone]  Chronic Pain Problems: Organization         Address  Phone   Notes  [facility]  [phone] Patients need to be referred by their  primary care doctor.   Medication Assistance: Organization         Address  Phone   Notes  [facility] Medication [facility] [address] [place], [place] [address] [phone] --Must be a resident of [facility] -- Must have NO insurance coverage whatsoever (no Medicaid/ Medicare, etc.) -- The pt. MUST have a primary care doctor that directs their care regularly and follows them in the community   [facility]  [phone]   [employer]  [phone]    Agencies that provide inexpensive medical care: Organization         Address  Phone   Notes  [facility]  [phone]   [facility]    [phone]   [facility] [address] [place], [place] [address] [phone]   [facility] [address] [place], [place] [phone]  [facility]    [phone]   [facility]    [phone]   [facility] [address], [place] Phone:  [phone], Fax:  [phone] Hours of Operation:  9 am - 6 pm, M-F.  Also accepts Medicaid/Medicare and self-pay.  [facility] [place] [address], [address], [place] Phone: [phone], Fax: [phone]. Hours of Operation:  8:30 am - 5:30 pm, M-F.  Also accepts Medicaid and self-pay.  [facility] [place] [address], [place] Phone: [phone]   [place], [place], [place] [phone], [address]. [phone] Mondays & Thursdays: 7-9 AM.  First 15 patients are seen on a first come, first serve basis.    [place] Providers:  Organization         Address  Phone   Notes  [facility] [address],  [phone] Also accepts self-pay patients.  [facility] [address], [place]  [phone]   [place], [address], [place] [phone]   [facility] [address] [place] [phone]   Lucianne Lei [address], [place]   [phone] Only accepts [place] patients after they have their name applied to their card.   Self-Pay (no insurance) in [facility]:  Organization         Address  Phone   Notes  Sickle Cell Patients, [facility] [place] [phone]   [facility] [place] [phone]   [facility] [place]  [place], [place], [place] [phone]   [facility]/Dr. Osei-Bonsu  [address] [place] or [address], [place] [phone] Phone number for both [place] and [place] locations is the same.  [facility] and [facility] [address], [place] [phone]   [facility] [address] [place] or [address] [phone]   [facility] [address], [place] [phone], phone; [phone], fax Sees patients 1st and 3rd Saturday of every month.  Must not qualify for public or private insurance (i.e. Medicaid, Medicare, [place] [employer] Choice, Veterans' Benefits)  Household income should be no more than 200% of the poverty level The clinic cannot treat you if you are pregnant or think you are pregnant  Sexually transmitted diseases are not treated at the clinic.

## 2018-09-20 NOTE — ED Triage Notes (Signed)
Pt c/o left eye swelling and redness and pain with palpation since yesterday. Denies drainage.

## 2018-09-20 NOTE — ED Provider Notes (Signed)
[facility]-EM[facility]PT Provider Note   CSN: [id number] Arrival date & time: 09/20/18  1716   History   Chief Complaint Chief Complaint  Patient presents with  . Facial Swelling    HPI Elaine Hawkins is a 48 y.o. female presents with mild facial edema around left eye onset yesterday at 9pm. Patient reports mild erythema and edema around left eye. Patient reports a constant soreness around the eye. Patient denies eye pain, itching, discharge, or redness. Patient states symptoms have worsened since this morning. Patient states she was evaluated by PCP and PCP advised her to come to the ER. Patient reports an intermittent dry cough, sore throat, and congestion for a few days. Patient denies fever, vision changes, pain with eye movement, nausea, vomiting, or abdominal pain. Patient denies any sick contacts. Patient denies wearing contacts and states she wears minimal make up. Patient denies any new make up products or face washes.   HPI  Past Medical History:  Diagnosis Date  . Medical history non-contributory   . Rash     Patient Active Problem List   Diagnosis Date Noted  . Vulvar itching 01/16/2018    Past Surgical History:  Procedure Laterality Date  . CESAREAN SECTION    . OOPHORECTOMY     L ovary  . TUBAL LIGATION       OB History    Gravida  2   Para  2   Term  2   Preterm  0   AB  0   Living  2     SAB  0   TAB  0   Ectopic  0   Multiple  0   Live Births  2            Home Medications    Prior to Admission medications   Medication Sig Start Date End Date Taking? Authorizing Provider  calcium-vitamin D (OSCAL WITH D) 250-125 MG-UNIT tablet Take 1 tablet by mouth daily.    [provider]  clindamycin (CLEOCIN) 150 MG capsule Take 2 capsules (300 mg total) by mouth every 8 (eight) hours for 5 days. 09/20/18 09/25/18  Carlyle BasquesHernandez, Camari Wisham P, PA-C  Clobetasol Prop Emollient Base (CLOBETASOL PROPIONATE E) 0.05 % emollient  cream Apply 1 application topically 1 day or 1 dose. Use tid x 3-4 wks, then drop to bid x 3-4 wks, then drop to daily x 3-4 wks, then drop to qod 01/16/18   Reva BoresPratt, Tanya S, MD  Skin Protectants, Misc. (CERAVE) OINT Apply 1 application topically daily as needed (dry skin and itching). 02/08/18   Pincus LargePhelps, Jazma Y, DO    Family History No family history on file.  Social History Social History   Tobacco Use  . Smoking status: Never Smoker  . Smokeless tobacco: Never Used  Substance Use Topics  . Alcohol use: Never    Frequency: Never  . Drug use: Never     Allergies   Patient has no known allergies.   Review of Systems Review of Systems  Constitutional: Negative for chills, diaphoresis and fever.  HENT: Positive for congestion, facial swelling, rhinorrhea and sore throat. Negative for ear pain.   Eyes: Negative for photophobia, pain, discharge, redness, itching and visual disturbance.  Respiratory: Positive for cough. Negative for shortness of breath.   Cardiovascular: Negative for chest pain.  Gastrointestinal: Negative for abdominal pain, nausea and vomiting.  Endocrine: Negative for cold intolerance and heat intolerance.  Musculoskeletal: Negative for neck pain and neck  stiffness.  Skin: Positive for color change. Negative for rash.  Allergic/Immunologic: Negative for immunocompromised state.  Neurological: Negative for headaches.  Hematological: Negative for adenopathy.     Physical Exam Updated Vital Signs BP 112/77 (BP Location: Right Arm)   Pulse 82   Temp 98.3 F (36.8 C) (Oral)   Resp 17   SpO2 98%   Physical Exam Vitals signs and nursing note reviewed.  Constitutional:      General: She is not in acute distress.    Appearance: She is well-developed. She is not diaphoretic.  HENT:     Head: Normocephalic and atraumatic.     Right Ear: Tympanic membrane, ear canal and external ear normal. There is no impacted cerumen.     Left Ear: Tympanic membrane, ear  canal and external ear normal. There is no impacted cerumen.     Nose: Congestion and rhinorrhea present.     Mouth/Throat:     Mouth: Mucous membranes are moist.     Pharynx: No oropharyngeal exudate or posterior oropharyngeal erythema.  Eyes:     General: Vision grossly intact. No scleral icterus.       Right eye: No foreign body, discharge or hordeolum.        Left eye: No foreign body, discharge or hordeolum.     Extraocular Movements: Extraocular movements intact.     Right eye: Normal extraocular motion and no nystagmus.     Left eye: Normal extraocular motion and no nystagmus.     Conjunctiva/sclera: Conjunctivae normal.     Right eye: Right conjunctiva is not injected. No chemosis, exudate or hemorrhage.    Left eye: Left conjunctiva is not injected. No chemosis, exudate or hemorrhage.    Pupils: Pupils are equal, round, and reactive to light.   Neck:     Musculoskeletal: Normal range of motion and neck supple.  Cardiovascular:     Rate and Rhythm: Normal rate and regular rhythm.     Heart sounds: Normal heart sounds. No murmur. No friction rub. No gallop.   Pulmonary:     Effort: Pulmonary effort is normal. No respiratory distress.     Breath sounds: Normal breath sounds. No wheezing or rales.  Abdominal:     Palpations: Abdomen is soft.     Tenderness: There is no abdominal tenderness.  Musculoskeletal: Normal range of motion.  Skin:    General: Skin is warm.     Findings: Erythema present. No rash.  Neurological:     Mental Status: She is alert.     ED Treatments / Results  Labs (all labs ordered are listed, but only abnormal results are displayed) Labs Reviewed - No data to display  EKG None  Radiology No results found.  Procedures Procedures (including critical care time)  Medications Ordered in ED Medications  ibuprofen (ADVIL,MOTRIN) tablet 600 mg (600 mg Oral Given 09/20/18 1848)     Initial Impression / Assessment and Plan / ED Course  I have  reviewed the triage vital signs and the nursing notes.  Pertinent labs & imaging results that were available during my care of the patient were reviewed by me and considered in my medical decision making (see chart for details).    Pt presents with mild left eye edema. Suspect symptoms may be due to mild preseptal cellulitis.  Exam non-concerning for orbital cellulitis. Patient denies eye pain. Pt does not have pain with EOMs. Pt is not a contact lens wearer. Do not suspect patient requires imaging at  this time. Patient will be discharged home with antibiotics. Discussed strict return precautions with patient. Patient has been instructed to use warm compresses and practice personal hygiene with frequent hand washing.  Patient understands to follow up with PCP and return to ER if new or worsening symptoms occur.   Final Clinical Impressions(s) / ED Diagnoses   Final diagnoses:  Left facial swelling    ED Discharge Orders         Ordered    clindamycin (CLEOCIN) 150 MG capsule  Every 8 hours     09/20/18 1934           Leretha Dykes, [place] 09/20/18 1939    Mancel Bale, MD 09/23/18 [phone]

## 2018-11-21 ENCOUNTER — Ambulatory Visit (INDEPENDENT_AMBULATORY_CARE_PROVIDER_SITE_OTHER): Payer: Medicaid Other

## 2018-11-21 ENCOUNTER — Other Ambulatory Visit (HOSPITAL_COMMUNITY)
Admission: RE | Admit: 2018-11-21 | Discharge: 2018-11-21 | Disposition: A | Discharge status: Home / Self Care | Payer: Medicaid Other | Source: Ambulatory Visit | Attending: Internal Medicine | Admitting: Internal Medicine

## 2018-11-21 DIAGNOSIS — N898 Other specified noninflammatory disorders of vagina: Secondary | ICD-10-CM

## 2018-11-21 NOTE — Progress Notes (Signed)
Pt here today for vaginal odor and some itchiness.  Pt explained how to obtain self swab and that we will call her with abnormal results within 24-48 hours.  Pt stated understanding with no further questions.

## 2018-11-23 LAB — CERVICOVAGINAL ANCILLARY ONLY
Bacterial vaginitis: POSITIVE — AB
CHLAMYDIA, DNA PROBE: NEGATIVE
Candida vaginitis: NEGATIVE
Neisseria Gonorrhea: NEGATIVE
Trichomonas: NEGATIVE

## 2018-11-30 ENCOUNTER — Telehealth: Payer: Self-pay | Admitting: *Deleted

## 2018-11-30 DIAGNOSIS — N760 Acute vaginitis: Principal | ICD-10-CM

## 2018-11-30 DIAGNOSIS — B9689 Other specified bacterial agents as the cause of diseases classified elsewhere: Secondary | ICD-10-CM

## 2018-11-30 MED ORDER — METRONIDAZOLE 500 MG PO TABS: 500.0000 mg | ORAL_TABLET | Freq: Two times a day (BID) | ORAL | 0 refills | Status: DC

## 2018-11-30 NOTE — Telephone Encounter (Signed)
Received a phone message from [facility] stating they are calling re: flagyl rx written today- states  It was rejected by Medicaid because she is is not in their system.- asking if can be ordered under other provider npi.  I called and gave then verbal order to use Dr. Shawnie Pons.

## 2018-11-30 NOTE — Telephone Encounter (Signed)
Megean left a voicemail last pm stating she was seen 11/21/18 and got results on MyChart that she was + BV ; but didn't get a prescription yet. Can you send one in to [facility] and call me?  I called Tresa Endo and notified her I got her message and will send the flagyl to her pharmacy today. She voices understanding.

## 2018-12-05 NOTE — Progress Notes (Signed)
Chart reviewed for nurse visit. Agree with plan of care.   Arvilla Market, DO 12/05/2018 4:50 PM

## 2019-04-10 ENCOUNTER — Ambulatory Visit: Payer: Medicaid Other | Admitting: Advanced Practice Midwife

## 2019-04-10 ENCOUNTER — Other Ambulatory Visit: Payer: Self-pay

## 2019-04-10 DIAGNOSIS — Z5329 Procedure and treatment not carried out because of patient's decision for other reasons: Secondary | ICD-10-CM

## 2019-04-10 DIAGNOSIS — Z91199 Patient's noncompliance with other medical treatment and regimen due to unspecified reason: Secondary | ICD-10-CM

## 2019-04-26 IMAGING — CR DG ELBOW COMPLETE 3+V*L*
4 series · 4 of 4 positions shown · non-contrast
Comparison: None.

CLINICAL DATA: Left elbow pain for several weeks, no known injury,
initial encounter

EXAM:
LEFT ELBOW - COMPLETE 3+ VIEW

[x elbow ap left]
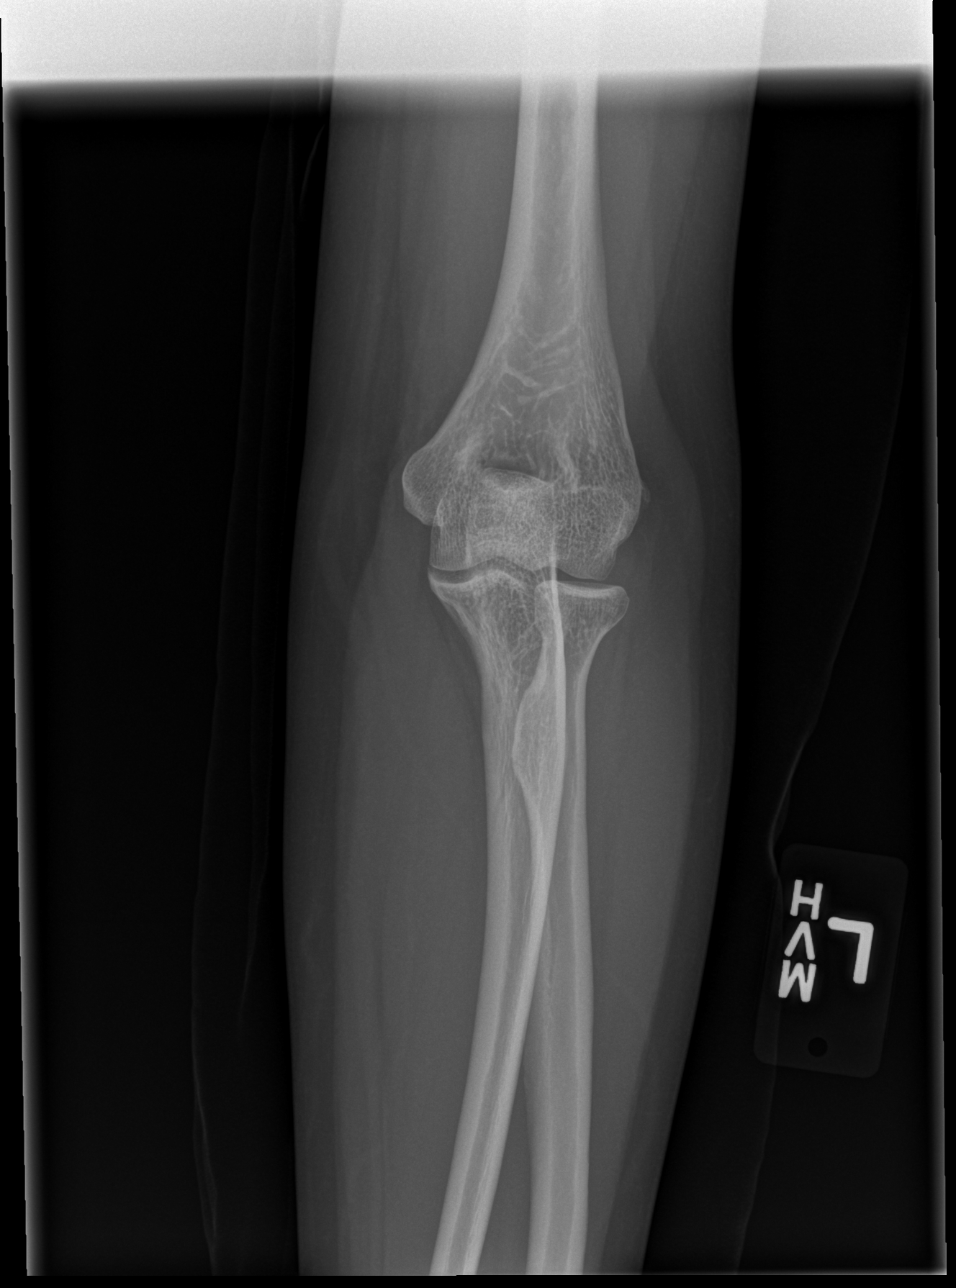

[x elbow obl left (1 of 2)]
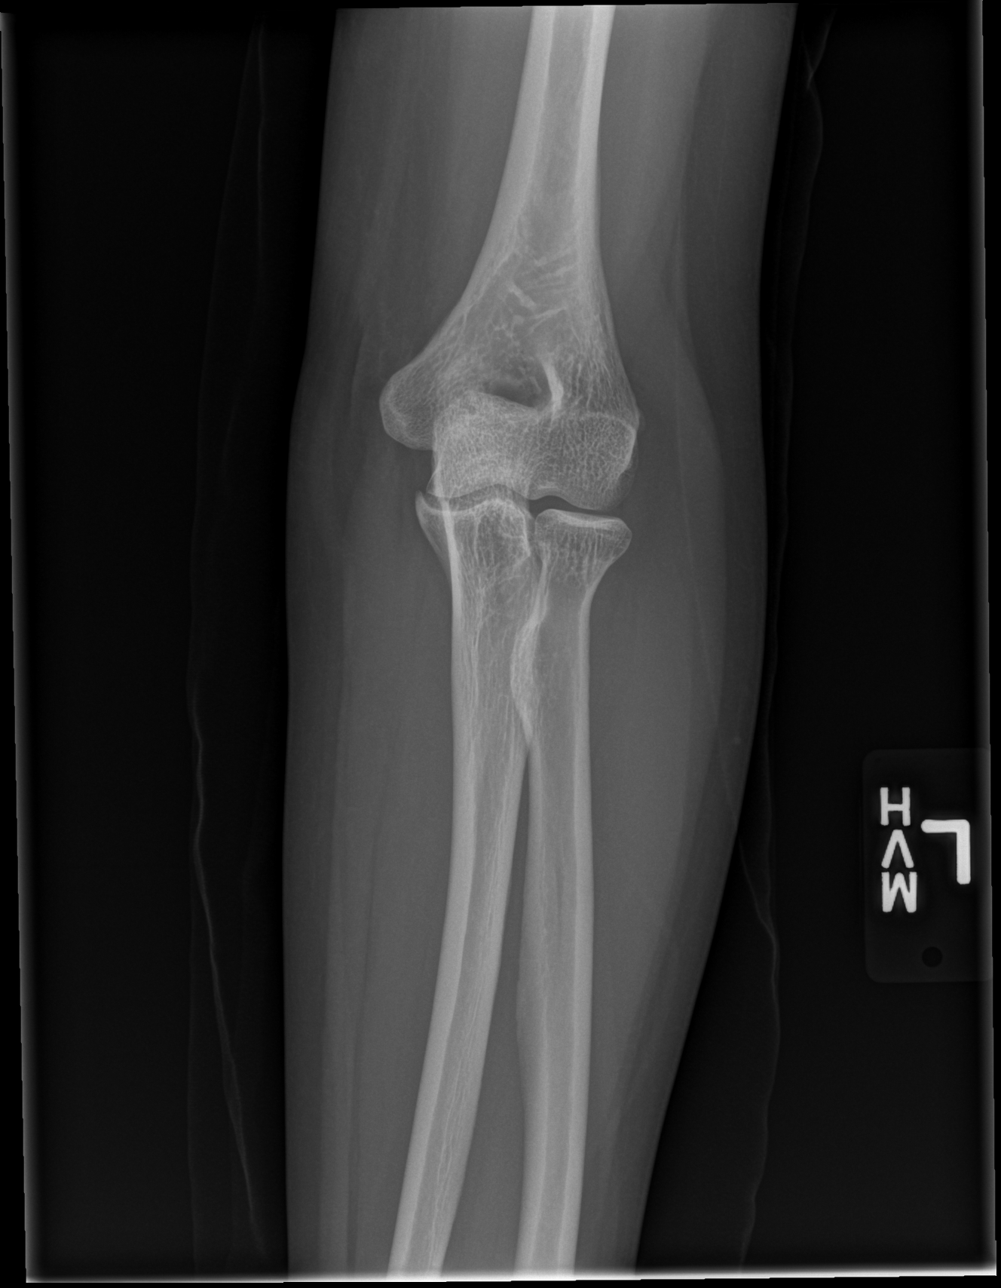

[x elbow obl left (2 of 2)]
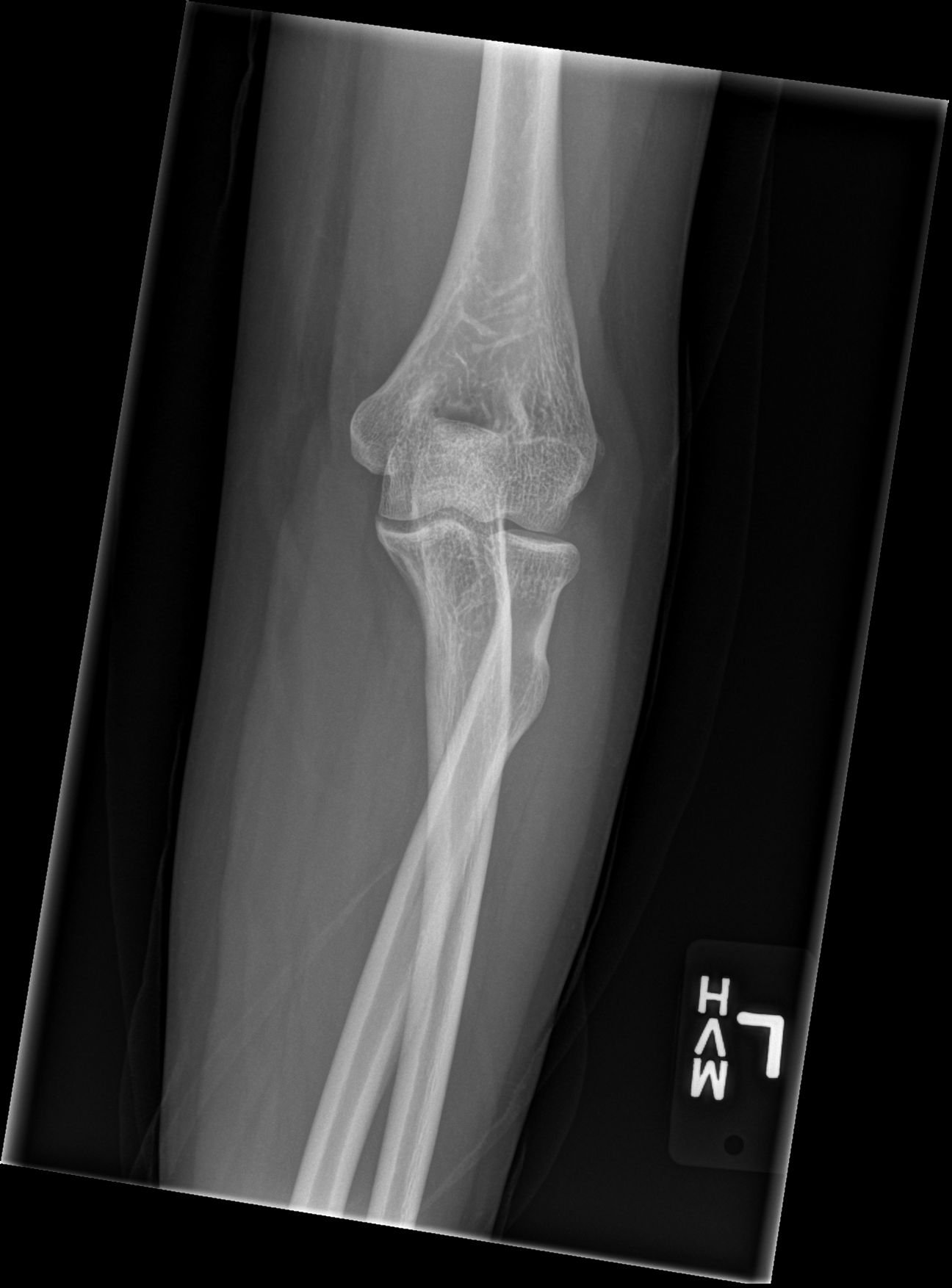

[x elbow lat left]
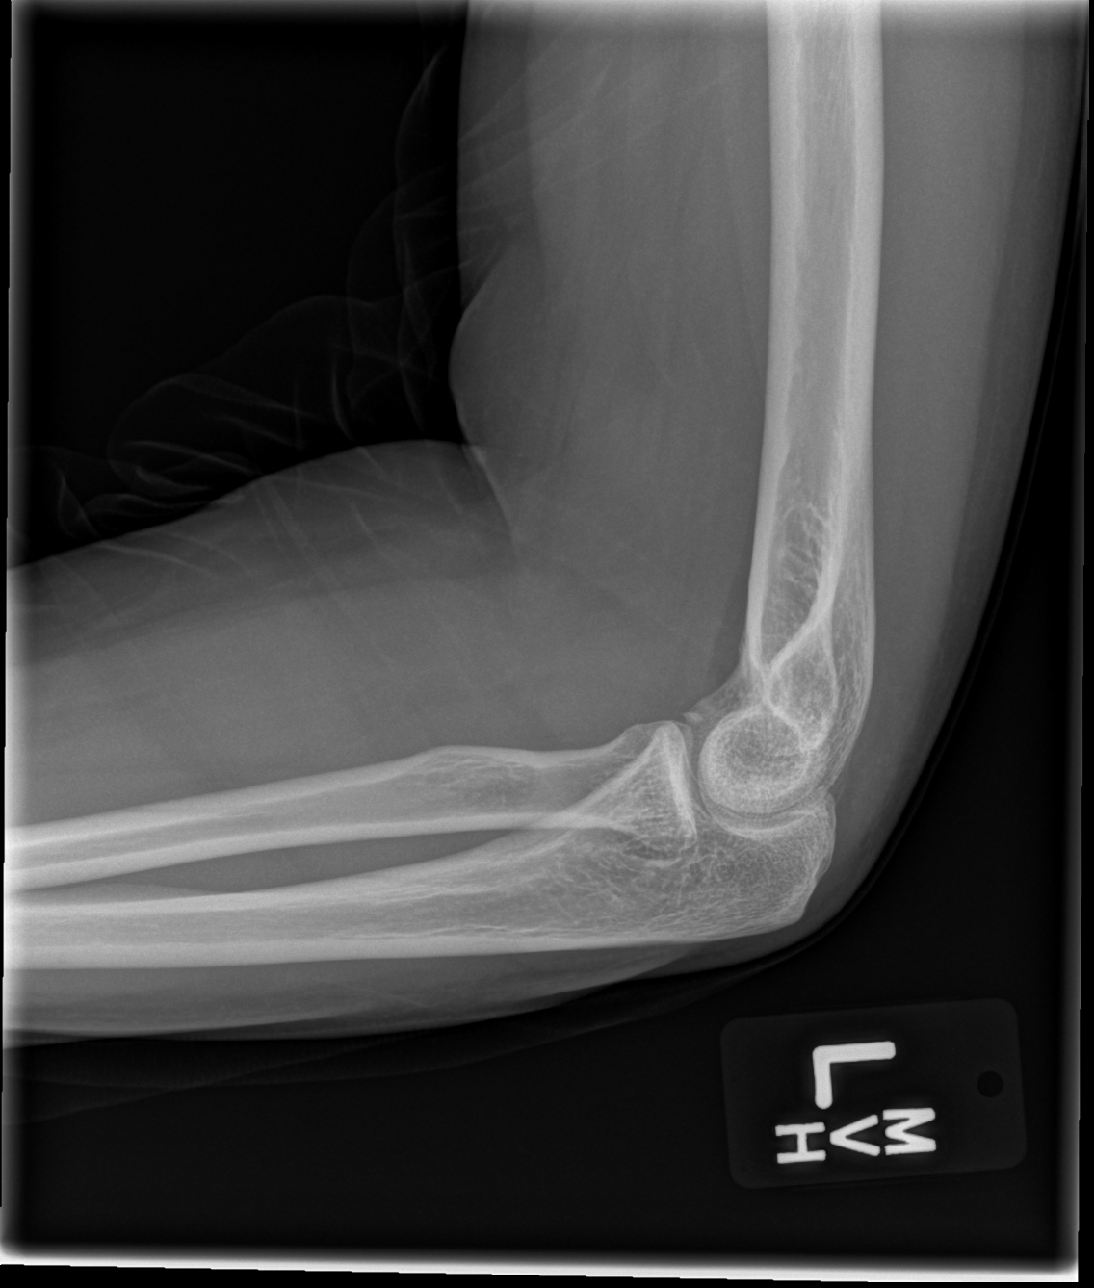

[4 of 4 positions shown; findings below may reference images not displayed]

FINDINGS: No acute fracture or dislocation is noted. Well corticated bony
density is noted adjacent to the coronoid process likely related to
prior trauma. No acute soft tissue abnormality is seen.
IMPRESSION: Changes consistent with prior trauma.  No acute abnormality noted.

## 2019-04-27 ENCOUNTER — Other Ambulatory Visit: Payer: Self-pay | Admitting: Family

## 2019-04-27 DIAGNOSIS — Z1231 Encounter for screening mammogram for malignant neoplasm of breast: Secondary | ICD-10-CM

## 2019-06-14 ENCOUNTER — Other Ambulatory Visit: Payer: Self-pay

## 2019-06-14 ENCOUNTER — Ambulatory Visit
Admission: RE | Admit: 2019-06-14 | Discharge: 2019-06-14 | Disposition: A | Discharge status: Home / Self Care | Payer: Medicaid Other | Source: Ambulatory Visit | Attending: Family | Admitting: Family

## 2019-06-14 DIAGNOSIS — Z1231 Encounter for screening mammogram for malignant neoplasm of breast: Secondary | ICD-10-CM

## 2019-07-06 ENCOUNTER — Other Ambulatory Visit: Payer: Self-pay | Admitting: Pediatrics

## 2019-07-16 ENCOUNTER — Other Ambulatory Visit: Payer: Self-pay

## 2019-07-16 ENCOUNTER — Encounter (HOSPITAL_COMMUNITY): Payer: Self-pay | Admitting: *Deleted

## 2019-07-16 ENCOUNTER — Emergency Department (HOSPITAL_COMMUNITY)
Admission: EM | Admit: 2019-07-16 | Discharge: 2019-07-16 | Disposition: A | Discharge status: Home / Self Care | Payer: Medicaid Other | Attending: Emergency Medicine | Admitting: Emergency Medicine

## 2019-07-16 DIAGNOSIS — Z23 Encounter for immunization: Secondary | ICD-10-CM | POA: Insufficient documentation

## 2019-07-16 DIAGNOSIS — W260XXA Contact with knife, initial encounter: Secondary | ICD-10-CM | POA: Insufficient documentation

## 2019-07-16 DIAGNOSIS — Y999 Unspecified external cause status: Secondary | ICD-10-CM | POA: Insufficient documentation

## 2019-07-16 DIAGNOSIS — Y939 Activity, unspecified: Secondary | ICD-10-CM | POA: Diagnosis not present

## 2019-07-16 DIAGNOSIS — S61211A Laceration without foreign body of left index finger without damage to nail, initial encounter: Secondary | ICD-10-CM | POA: Diagnosis not present

## 2019-07-16 DIAGNOSIS — S6992XA Unspecified injury of left wrist, hand and finger(s), initial encounter: Secondary | ICD-10-CM | POA: Diagnosis present

## 2019-07-16 DIAGNOSIS — Y929 Unspecified place or not applicable: Secondary | ICD-10-CM | POA: Diagnosis not present

## 2019-07-16 MED ORDER — LIDOCAINE-EPINEPHRINE-TETRACAINE (LET) SOLUTION
3.0000 mL | Freq: Once | NASAL | Status: AC
  Administered 2019-07-16: 3 mL via TOPICAL
  Filled 2019-07-16: qty 3

## 2019-07-16 MED ORDER — LIDOCAINE HCL 1 % IJ SOLN
INTRAMUSCULAR | Status: AC
  Filled 2019-07-16: qty 20

## 2019-07-16 MED ORDER — TETANUS-DIPHTH-ACELL PERTUSSIS 5-2.5-18.5 LF-MCG/0.5 IM SUSP
0.5000 mL | Freq: Once | INTRAMUSCULAR | Status: AC
  Administered 2019-07-16: 0.5 mL via INTRAMUSCULAR
  Filled 2019-07-16: qty 0.5

## 2019-07-16 NOTE — ED Provider Notes (Signed)
[facility] DEPT Provider Note   CSN: [id number] Arrival date & time: 07/16/19  5366     History   Chief Complaint Chief Complaint  Patient presents with  . Laceration    Left index finger    HPI Elaine Hawkins is a 48 y.o. female.     The history is provided by the patient.  Laceration Location:  Finger Finger laceration location:  L index finger Length:  1cm Depth:  Through dermis Quality: avulsion   Bleeding: arterial and controlled   Time since incident:  1 hour Laceration mechanism:  Knife Pain details:    Quality:  Aching   Severity:  Mild   Timing:  Constant   Progression:  Unchanged Foreign body present:  No foreign bodies Relieved by:  Pressure Worsened by:  Nothing Ineffective treatments:  None tried Tetanus status:  Unknown Associated symptoms: no focal weakness and no numbness     Past Medical History:  Diagnosis Date  . Medical history non-contributory   . Rash     Patient Active Problem List   Diagnosis Date Noted  . Vulvar itching 01/16/2018    Past Surgical History:  Procedure Laterality Date  . CESAREAN SECTION    . OOPHORECTOMY     L ovary  . TUBAL LIGATION       OB History    Gravida  2   Para  2   Term  2   Preterm  0   AB  0   Living  2     SAB  0   TAB  0   Ectopic  0   Multiple  0   Live Births  2            Home Medications    Prior to Admission medications   Medication Sig Start Date End Date Taking? Authorizing Provider  calcium-vitamin D (OSCAL WITH D) 250-125 MG-UNIT tablet Take 1 tablet by mouth daily.    [provider]  Clobetasol Prop Emollient Base (CLOBETASOL PROPIONATE E) 0.05 % emollient cream Apply 1 application topically 1 day or 1 dose. Use tid x 3-4 wks, then drop to bid x 3-4 wks, then drop to daily x 3-4 wks, then drop to qod 01/16/18   Donnamae Jude, MD  metroNIDAZOLE (FLAGYL) 500 MG tablet Take 1 tablet (500 mg total) by mouth 2 (two)  times daily. 11/30/18   Nicolette Bang, DO  Skin Protectants, Misc. (CERAVE) OINT Apply 1 application topically daily as needed (dry skin and itching). 02/08/18   Katheren Shams, DO    Family History No family history on file.  Social History Social History   Tobacco Use  . Smoking status: Never Smoker  . Smokeless tobacco: Never Used  Substance Use Topics  . Alcohol use: Never    Frequency: Never  . Drug use: Never     Allergies   Patient has no known allergies.   Review of Systems Review of Systems  Neurological: Negative for focal weakness.  All other systems reviewed and are negative.    Physical Exam Updated Vital Signs BP 116/81 (BP Location: Left Arm)   Pulse 71   Temp 98 F (36.7 C) (Oral)   Resp 16   Ht 5' (1.524 m)   Wt 54.9 kg   SpO2 98%   BMI 23.63 kg/m   Physical Exam Vitals signs and nursing note reviewed.  Constitutional:      Appearance: Normal appearance. She  is normal weight.  HENT:     Head: Normocephalic.  Cardiovascular:     Rate and Rhythm: Normal rate.  Pulmonary:     Effort: Pulmonary effort is normal.  Musculoskeletal:       Hands:  Skin:    General: Skin is warm.     Capillary Refill: Capillary refill takes less than 2 seconds.  Neurological:     General: No focal deficit present.     Mental Status: She is alert. Mental status is at baseline.  Psychiatric:        Mood and Affect: Mood normal.        Behavior: Behavior normal.        Thought Content: Thought content normal.      ED Treatments / Results  Labs (all labs ordered are listed, but only abnormal results are displayed) Labs Reviewed - No data to display  EKG None  Radiology No results found.  Procedures Procedures (including critical care time)  LACERATION REPAIR Performed by: [facility] Authorized by: Gwyneth Sprout Consent: Verbal consent obtained. Risks and benefits: risks, benefits and alternatives were discussed Consent  given by: patient Patient identity confirmed: provided demographic data Prepped and Draped in normal sterile fashion Wound explored  Laceration Location: left index fingertip  Laceration Length: 1cm  No Foreign Bodies seen or palpated  Anesthesia: local infiltration  Local anesthetic: lidocaine 1% without epinephrine  Anesthetic total: 1 ml  Irrigation method: syringe Amount of cleaning: standard  Skin closure: 4.0 vicryl rapide  Number of sutures: 3  Technique: simple interrupted  Patient tolerance: Patient tolerated the procedure well with no immediate complications.   Medications Ordered in ED Medications  lidocaine-EPINEPHrine-tetracaine (LET) solution (has no administration in time range)  Tdap (BOOSTRIX) injection 0.5 mL (has no administration in time range)     Initial Impression / Assessment and Plan / ED Course  I have reviewed the triage vital signs and the nursing notes.  Pertinent labs & imaging results that were available during my care of the patient were reviewed by me and considered in my medical decision making (see chart for details).        Patient with a laceration to the tip of her finger.  Repaired as above.  No complications or concern for underlying injury.  Tetanus shot updated  Final Clinical Impressions(s) / ED Diagnoses   Final diagnoses:  Laceration of left index finger without foreign body without damage to nail, initial encounter    ED Discharge Orders    None       Gwyneth Sprout, MD 07/16/19 1108

## 2019-07-16 NOTE — ED Triage Notes (Signed)
Pt cut left index finger on knife this morning, controlled bleeding continue to be present in triage

## 2019-07-22 ENCOUNTER — Emergency Department (HOSPITAL_COMMUNITY): Payer: Medicaid Other

## 2019-07-22 ENCOUNTER — Emergency Department (HOSPITAL_COMMUNITY)
Admission: EM | Admit: 2019-07-22 | Discharge: 2019-07-22 | Disposition: A | Discharge status: Home / Self Care | Payer: Medicaid Other | Attending: Emergency Medicine | Admitting: Emergency Medicine

## 2019-07-22 ENCOUNTER — Other Ambulatory Visit: Payer: Self-pay

## 2019-07-22 DIAGNOSIS — L089 Local infection of the skin and subcutaneous tissue, unspecified: Secondary | ICD-10-CM | POA: Insufficient documentation

## 2019-07-22 DIAGNOSIS — R6883 Chills (without fever): Secondary | ICD-10-CM | POA: Diagnosis not present

## 2019-07-22 DIAGNOSIS — Y929 Unspecified place or not applicable: Secondary | ICD-10-CM | POA: Insufficient documentation

## 2019-07-22 DIAGNOSIS — Z79899 Other long term (current) drug therapy: Secondary | ICD-10-CM | POA: Diagnosis not present

## 2019-07-22 DIAGNOSIS — Z20822 Contact with and (suspected) exposure to covid-19: Secondary | ICD-10-CM

## 2019-07-22 DIAGNOSIS — S6992XA Unspecified injury of left wrist, hand and finger(s), initial encounter: Secondary | ICD-10-CM | POA: Diagnosis present

## 2019-07-22 DIAGNOSIS — Y998 Other external cause status: Secondary | ICD-10-CM | POA: Diagnosis not present

## 2019-07-22 DIAGNOSIS — U071 COVID-19: Secondary | ICD-10-CM | POA: Insufficient documentation

## 2019-07-22 DIAGNOSIS — Y939 Activity, unspecified: Secondary | ICD-10-CM | POA: Insufficient documentation

## 2019-07-22 DIAGNOSIS — S60451A Superficial foreign body of left index finger, initial encounter: Secondary | ICD-10-CM | POA: Diagnosis not present

## 2019-07-22 DIAGNOSIS — R05 Cough: Secondary | ICD-10-CM | POA: Insufficient documentation

## 2019-07-22 DIAGNOSIS — X58XXXA Exposure to other specified factors, initial encounter: Secondary | ICD-10-CM | POA: Insufficient documentation

## 2019-07-22 DIAGNOSIS — Z4802 Encounter for removal of sutures: Secondary | ICD-10-CM | POA: Diagnosis not present

## 2019-07-22 LAB — CBC WITH DIFFERENTIAL/PLATELET
Abs Immature Granulocytes: 0 10*3/uL (ref 0.00–0.07)
Basophils Absolute: 0 10*3/uL (ref 0.0–0.1)
Basophils Relative: 1 %
Eosinophils Absolute: 0.3 10*3/uL (ref 0.0–0.5)
Eosinophils Relative: 7 %
HCT: 45.3 % (ref 36.0–46.0)
Hemoglobin: 15 g/dL (ref 12.0–15.0)
Immature Granulocytes: 0 %
Lymphocytes Relative: 29 %
Lymphs Abs: 1 10*3/uL (ref 0.7–4.0)
MCH: 32.7 pg (ref 26.0–34.0)
MCHC: 33.1 g/dL (ref 30.0–36.0)
MCV: 98.7 fL (ref 80.0–100.0)
Monocytes Absolute: 0.4 10*3/uL (ref 0.1–1.0)
Monocytes Relative: 11 %
Neutro Abs: 1.9 10*3/uL (ref 1.7–7.7)
Neutrophils Relative %: 52 %
Platelets: 197 10*3/uL (ref 150–400)
RBC: 4.59 MIL/uL (ref 3.87–5.11)
RDW: 11.7 % (ref 11.5–15.5)
WBC: 3.6 10*3/uL — ABNORMAL LOW (ref 4.0–10.5)
nRBC: 0 % (ref 0.0–0.2)

## 2019-07-22 LAB — BASIC METABOLIC PANEL
Anion gap: 10 (ref 5–15)
BUN: 11 mg/dL (ref 6–20)
CO2: 24 mmol/L (ref 22–32)
Calcium: 9.2 mg/dL (ref 8.9–10.3)
Chloride: 103 mmol/L (ref 98–111)
Creatinine, Ser: 0.62 mg/dL (ref 0.44–1.00)
GFR calc Af Amer: >60 mL/min (ref >60)
GFR calc non Af Amer: >60 mL/min (ref >60)
Glucose, Bld: 123 mg/dL — ABNORMAL HIGH (ref 70–99)
Potassium: 3.2 mmol/L — ABNORMAL LOW (ref 3.5–5.1)
Sodium: 137 mmol/L (ref 135–145)

## 2019-07-22 MED ORDER — DOXYCYCLINE HYCLATE 100 MG PO CAPS: 100.0000 mg | ORAL_CAPSULE | Freq: Two times a day (BID) | ORAL | 0 refills | Status: DC

## 2019-07-22 MED ORDER — CEPHALEXIN 500 MG PO CAPS: 500.0000 mg | ORAL_CAPSULE | Freq: Four times a day (QID) | ORAL | 0 refills | Status: AC

## 2019-07-22 NOTE — ED Triage Notes (Signed)
Last Monday had sutures placed in the left hand index finger, concerned about infection today. Reports that she was not given any antibiotics during her last visit. +body aches, fever, mild pain, and pus pocket noted at site.

## 2019-07-22 NOTE — ED Provider Notes (Signed)
[facility]-EMERGENCY DEPT Provider Note   CSN: [id number] Arrival date & time: 07/22/19  1216     History   Chief Complaint Chief Complaint  Patient presents with  . Finger Injury    HPI Shelby MattocksKelly Trang Barnie AldermanHuynh is a 48 y.o. female with a past medical history of bilateral tubal ligation, who presents today for evaluation of finger pain.  She was seen 6 days ago for a laceration on the left index finger.  At that time she had 3 sutures placed and her Tdap was updated.  She was not sent home with antibiotics.  She reports that 2 days ago she started having worsening pain and swelling in her distal left index finger.  She reports that she had a cough and then has been having chills.  She denies any known fevers.  She also reports body aches.  She reports pain with touch or bending her finger.  She denies any headache, changes to sense of taste or smell.  No known covid contact, however was in the emergency room for suturing.      HPI  Past Medical History:  Diagnosis Date  . Medical history non-contributory   . Rash     Patient Active Problem List   Diagnosis Date Noted  . Vulvar itching 01/16/2018    Past Surgical History:  Procedure Laterality Date  . CESAREAN SECTION    . OOPHORECTOMY     L ovary  . TUBAL LIGATION       OB History    Gravida  2   Para  2   Term  2   Preterm  0   AB  0   Living  2     SAB  0   TAB  0   Ectopic  0   Multiple  0   Live Births  2            Home Medications    Prior to Admission medications   Medication Sig Start Date End Date Taking? Authorizing Provider  calcium-vitamin D (OSCAL WITH D) 250-125 MG-UNIT tablet Take 1 tablet by mouth daily.    [provider]  cephALEXin (KEFLEX) 500 MG capsule Take 1 capsule (500 mg total) by mouth 4 (four) times daily for 7 days. 07/22/19 07/29/19  Cristina GongHammond, Elizabeth W, PA-C  Clobetasol Prop Emollient Base (CLOBETASOL PROPIONATE E) 0.05 % emollient cream  Apply 1 application topically 1 day or 1 dose. Use tid x 3-4 wks, then drop to bid x 3-4 wks, then drop to daily x 3-4 wks, then drop to qod 01/16/18   Reva BoresPratt, Tanya S, MD  doxycycline (VIBRAMYCIN) 100 MG capsule Take 1 capsule (100 mg total) by mouth 2 (two) times daily. 07/22/19   Cristina GongHammond, Elizabeth W, PA-C  metroNIDAZOLE (FLAGYL) 500 MG tablet Take 1 tablet (500 mg total) by mouth 2 (two) times daily. 11/30/18   Arvilla MarketWallace, Catherine Lauren, DO  Skin Protectants, Misc. (CERAVE) OINT Apply 1 application topically daily as needed (dry skin and itching). 02/08/18   Pincus LargePhelps, Jazma Y, DO    Family History No family history on file.  Social History Social History   Tobacco Use  . Smoking status: Never Smoker  . Smokeless tobacco: Never Used  Substance Use Topics  . Alcohol use: Never    Frequency: Never  . Drug use: Never     Allergies   Patient has no known allergies.   Review of Systems Review of Systems  Constitutional: Positive for  chills. Negative for fever.  HENT: Negative for congestion.   Respiratory: Positive for cough (For one day, resolved). Negative for choking, chest tightness and shortness of breath.   Cardiovascular: Negative for chest pain.  Gastrointestinal: Negative for abdominal pain.  Musculoskeletal: Positive for myalgias. Negative for neck pain.       Swelling of left index finger.   Neurological: Negative for weakness and headaches.  All other systems reviewed and are negative.    Physical Exam Updated Vital Signs BP 121/64   Pulse 89   Temp 98.9 F (37.2 C) (Oral)   Resp 18   SpO2 100%   Physical Exam Vitals signs and nursing note reviewed.  Constitutional:      General: She is not in acute distress.    Appearance: She is well-developed. She is not diaphoretic.  HENT:     Head: Normocephalic and atraumatic.  Eyes:     General: No scleral icterus.       Right eye: No discharge.        Left eye: No discharge.     Conjunctiva/sclera: Conjunctivae  normal.  Neck:     Musculoskeletal: Normal range of motion.  Cardiovascular:     Rate and Rhythm: Normal rate and regular rhythm.  Pulmonary:     Effort: Pulmonary effort is normal. No respiratory distress.     Breath sounds: No stridor.  Abdominal:     General: There is no distension.  Musculoskeletal:        General: No deformity.     Comments: There is decreased range of motion of left index finger secondary to pain and feelings of fullness.  She is able to bend the DIP and PIP of the left index finger past 45 degrees.    Skin:    General: Skin is warm and dry.     Comments: Please see clinical image.  The left index finger from the PIP distally is swollen when compared to the right index finger.  There is a small white area around the most radial suture on the proximal side.  Neurological:     General: No focal deficit present.     Mental Status: She is alert.     Motor: No abnormal muscle tone.     Comments: Sensation intact to distal tip of left index finger.  Psychiatric:        Behavior: Behavior normal.              ED Treatments / Results  Labs (all labs ordered are listed, but only abnormal results are displayed) Labs Reviewed  CBC WITH DIFFERENTIAL/PLATELET - Abnormal; Notable for the following components:      Result Value   WBC 3.6 (*)    All other components within normal limits  BASIC METABOLIC PANEL - Abnormal; Notable for the following components:   Potassium 3.2 (*)    Glucose, Bld 123 (*)    All other components within normal limits  CULTURE, BLOOD (ROUTINE X 2)  CULTURE, BLOOD (ROUTINE X 2)  SARS CORONAVIRUS 2 (TAT 6-24 HRS)    EKG None  Radiology Dg Finger Index Left  Result Date: 07/22/2019 CLINICAL DATA:  Patient states she cut her left index finger while trying to cut open a plastic bag a week ago. Swelling and pain. EXAM: LEFT INDEX FINGER 2+V COMPARISON:  None. FINDINGS: No acute fracture or dislocation. No aggressive osseous  lesion. Small linear 1 mm foreign body in the soft tissues along the radial aspect  of the second distal phalanx. IMPRESSION: No acute osseous injury of the left second digit. Small linear 1 mm foreign body in the soft tissues along the radial aspect of the second distal phalanx. Electronically Signed   By: Elige Ko   On: 07/22/2019 13:50    Procedures .Suture Removal  Date/Time: 07/22/2019 1:05 PM Performed by: Cristina Gong, PA-C Authorized by: Cristina Gong, PA-C   Consent:    Consent obtained:  Verbal   Consent given by:  Patient   Risks discussed:  Bleeding, pain and wound separation   Alternatives discussed:  No treatment, alternative treatment and referral Procedure details:    Wound appearance:  Draining and tender   Drainage characteristics:  Small ammount of thick yellow drainage   Number of sutures removed:  1 Post-procedure details:    Patient tolerance of procedure:  Procedure terminated at patient's request Comments:     Discussed that the purpose of this is to allow for drainage of any purulent or infected material.  While removing for suture, which had already become on knotted, patient pulled her hand away causing rapid removal of the suture material.   (including critical care time)  Medications Ordered in ED Medications - No data to display   Initial Impression / Assessment and Plan / ED Course  I have reviewed the triage vital signs and the nursing notes.  Pertinent labs & imaging results that were available during my care of the patient were reviewed by me and considered in my medical decision making (see chart for details).       Patient presents today for evaluation of chills, body aches, swelling of the left index finger after a laceration that was sutured 6 days ago but all started 2 days ago.  She had a cough on the first day however reports that has fully resolved.  She does have swelling around the finger which she reports is new over  the past 2 days.  CBC and BMP obtained showing leukopenia at 3.6 with mild hypokalemia 3.2.  She is informed of these results, instructed to increase dietary potassium.  Blood cultures were obtained given her reported history of body aches and somatic symptoms in the setting of concern for infection.  X-rays were obtained showing a small metallic retained foreign body, uncertain if this is from the cut or a different event.  Attempted suture removal however patient did not tolerate.  I suspect, given that she also reports that she had 1 day of cough, with her myalgias, fatigue, possible fever and leukopenia that she may have a coronavirus infection causing her systemic symptoms.  Coronavirus testing was sent.  She had been advised to quarantine herself at home until results return.  She will be treated with Keflex and doxycycline.  She is given follow-up with hand for additional evaluation.  As she is able to bend her finger significantly I do not suspect acute flexor tenosynovitis however we discussed the importance of close outpatient follow-up.  Return precautions were discussed with patient who states their understanding.  At the time of discharge patient denied any unaddressed complaints or concerns.  Patient is agreeable for discharge home.  Argie Lober Mancino was evaluated in Emergency Department on 07/22/2019 for the symptoms described in the history of present illness. She was evaluated in the context of the global COVID-19 pandemic, which necessitated consideration that the patient might be at risk for infection with the SARS-CoV-2 virus that causes COVID-19. Institutional protocols and algorithms that  pertain to the evaluation of patients at risk for COVID-19 are in a state of rapid change based on information released by regulatory bodies including the CDC and federal and state organizations. These policies and algorithms were followed during the patient's care in the ED.    Final Clinical  Impressions(s) / ED Diagnoses   Final diagnoses:  Foreign body of left index finger with infection  Suspected COVID-19 virus infection    ED Discharge Orders         Ordered    doxycycline (VIBRAMYCIN) 100 MG capsule  2 times daily     07/22/19 1515    cephALEXin (KEFLEX) 500 MG capsule  4 times daily     07/22/19 1515           Cristina Gong, [place] 07/22/19 1606    Charlynne Pander, MD 07/22/19 2100

## 2019-07-22 NOTE — Discharge Instructions (Addendum)

## 2019-07-23 ENCOUNTER — Other Ambulatory Visit: Payer: Self-pay

## 2019-07-23 ENCOUNTER — Encounter (HOSPITAL_COMMUNITY): Payer: Self-pay | Admitting: Emergency Medicine

## 2019-07-23 ENCOUNTER — Emergency Department (HOSPITAL_COMMUNITY)
Admission: EM | Admit: 2019-07-23 | Discharge: 2019-07-23 | Disposition: A | Discharge status: Home / Self Care | Payer: Medicaid Other | Attending: Emergency Medicine | Admitting: Emergency Medicine

## 2019-07-23 DIAGNOSIS — Z4802 Encounter for removal of sutures: Secondary | ICD-10-CM | POA: Diagnosis not present

## 2019-07-23 DIAGNOSIS — X58XXXD Exposure to other specified factors, subsequent encounter: Secondary | ICD-10-CM | POA: Insufficient documentation

## 2019-07-23 DIAGNOSIS — S61211D Laceration without foreign body of left index finger without damage to nail, subsequent encounter: Secondary | ICD-10-CM | POA: Diagnosis not present

## 2019-07-23 LAB — SARS CORONAVIRUS 2 (TAT 6-24 HRS): SARS Coronavirus 2: POSITIVE — AB

## 2019-07-23 NOTE — Discharge Instructions (Addendum)
You were evaluated in the Emergency Department and after careful evaluation, we did not find any emergent condition requiring admission or further testing in the hospital.  Your exam/testing today was overall reassuring.  Your sutures removed here in the emergency department.  Please complete your antibiotics.  Please return to the Emergency Department if you experience any worsening of your condition.  We encourage you to follow up with a primary care provider.  Thank you for allowing [place] to be a part of your care.

## 2019-07-23 NOTE — ED Triage Notes (Addendum)
Patient here from home with complaints of left index finger suture removal. States that sutures were unable to be removed because there was no "numbing spray". Patient reports that she is COVID +.

## 2019-07-23 NOTE — ED Provider Notes (Signed)
[facility]-EMERGENCY DEPT [facility] Emergency Department Provider Note MRN:  [id number]  Arrival date & time: 07/23/19     Chief Complaint   Hand Pain   History of Present Illness   Elaine Hawkins is a 48 y.o. year-old female with no pertinent past medical history presenting to the ED with chief complaint of hand pain.  Patient here for suture removal of her left index finger.  Was here yesterday, but due to pain and lack of numbing medication all of the sutures could not be removed.  She has been taking antibiotics for 1 day.  Denies fever, no other symptoms.  Patient is Covid positive but had symptoms only for 4 days.  Review of Systems  A complete 10 system review of systems was obtained and all systems are negative except as noted in the HPI and PMH.   Patient's Health History    Past Medical History:  Diagnosis Date  . Medical history non-contributory   . Rash     Past Surgical History:  Procedure Laterality Date  . CESAREAN SECTION    . OOPHORECTOMY     L ovary  . TUBAL LIGATION      No family history on file.  Social History   Socioeconomic History  . Marital status: Single    Spouse name: Not on file  . Number of children: Not on file  . Years of education: Not on file  . Highest education level: Not on file  Occupational History  . Not on file  Social Needs  . Financial resource strain: Not on file  . Food insecurity    Worry: Not on file    Inability: Not on file  . Transportation needs    Medical: Not on file    Non-medical: Not on file  Tobacco Use  . Smoking status: Never Smoker  . Smokeless tobacco: Never Used  Substance and Sexual Activity  . Alcohol use: Never    Frequency: Never  . Drug use: Never  . Sexual activity: Yes    Birth control/protection: Surgical  Lifestyle  . Physical activity    Days per week: Not on file    Minutes per session: Not on file  . Stress: Not on file  Relationships  . Social Musician on  phone: Not on file    Gets together: Not on file    Attends religious service: Not on file    Active member of club or organization: Not on file    Attends meetings of clubs or organizations: Not on file    Relationship status: Not on file  . Intimate partner violence    Fear of current or ex partner: Not on file    Emotionally abused: Not on file    Physically abused: Not on file    Forced sexual activity: Not on file  Other Topics Concern  . Not on file  Social History Narrative  . Not on file     Physical Exam  Vital Signs and Nursing Notes reviewed Vitals:   07/23/19 1414  BP: 127/81  Pulse: 91  Resp: 18  Temp: 98.5 F (36.9 C)  SpO2: 97%    CONSTITUTIONAL: Well-appearing, NAD NEURO:  Alert and oriented x 3, no focal deficits EYES:  eyes equal and reactive ENT/NECK:  no LAD, no JVD CARDIO: Regular rate, well-perfused, normal S1 and S2 PULM:  CTAB no wheezing or rhonchi GI/GU:  normal bowel sounds, non-distended, non-tender MSK/SPINE:  No gross deformities,  no edema SKIN:  no rash, atraumatic; volar aspect of the distal tip of the left pointer finger with 2 sutures in place, well-healing PSYCH:  Appropriate speech and behavior  Diagnostic and Interventional Summary    EKG Interpretation  Date/Time:    Ventricular Rate:    PR Interval:    QRS Duration:   QT Interval:    QTC Calculation:   R Axis:     Text Interpretation:        Labs Reviewed - No data to display  No orders to display    Medications - No data to display   Procedures  /  Critical Care .Suture Removal  Date/Time: 07/23/2019 3:26 PM Performed by: Maudie Flakes, MD Authorized by: Maudie Flakes, MD   Consent:    Consent obtained:  Verbal   Consent given by:  Patient   Risks discussed:  Pain Location:    Location:  Upper extremity   Upper extremity location:  Hand   Hand location:  L index finger Procedure details:    Wound appearance:  Good wound healing, clean and pink    Number of sutures removed:  2 Post-procedure details:    Patient tolerance of procedure:  Tolerated well, no immediate complications    ED Course and Medical Decision Making  I have reviewed the triage vital signs and the nursing notes.  Pertinent labs & imaging results that were available during my care of the patient were reviewed by me and considered in my medical decision making (see below for details).    Sutures removed as described above, appropriate for discharge with completion of antibiotic course.  Barth Kirks. Sedonia Small, MD [place] [email or web address]  Final Clinical Impressions(s) / ED Diagnoses     ICD-10-CM   1. Visit for suture removal  Z48.02     ED Discharge Orders    None       Discharge Instructions Discussed with and Provided to Patient:     Discharge Instructions     You were evaluated in the Emergency Department and after careful evaluation, we did not find any emergent condition requiring admission or further testing in the hospital.  Your exam/testing today was overall reassuring.  Your sutures removed here in the emergency department.  Please complete your antibiotics.  Please return to the Emergency Department if you experience any worsening of your condition.  We encourage you to follow up with a primary care provider.  Thank you for allowing [place] to be a part of your care.       Maudie Flakes, MD 07/23/19 [phone]

## 2019-07-27 LAB — CULTURE, BLOOD (ROUTINE X 2)
Culture: NO GROWTH
Special Requests: ADEQUATE

## 2019-09-17 ENCOUNTER — Emergency Department (HOSPITAL_COMMUNITY)
Admission: EM | Admit: 2019-09-17 | Discharge: 2019-09-17 | Disposition: A | Discharge status: Home / Self Care | Payer: Medicaid Other | Attending: Emergency Medicine | Admitting: Emergency Medicine

## 2019-09-17 ENCOUNTER — Encounter (HOSPITAL_COMMUNITY): Payer: Self-pay | Admitting: Emergency Medicine

## 2019-09-17 ENCOUNTER — Other Ambulatory Visit: Payer: Self-pay

## 2019-09-17 DIAGNOSIS — R21 Rash and other nonspecific skin eruption: Secondary | ICD-10-CM

## 2019-09-17 DIAGNOSIS — R220 Localized swelling, mass and lump, head: Secondary | ICD-10-CM | POA: Diagnosis not present

## 2019-09-17 DIAGNOSIS — Z79899 Other long term (current) drug therapy: Secondary | ICD-10-CM | POA: Insufficient documentation

## 2019-09-17 MED ORDER — EPINEPHRINE 0.3 MG/0.3ML IJ SOAJ: 0.3000 mg | INTRAMUSCULAR | 1 refills | Status: AC | PRN

## 2019-09-17 MED ORDER — DEXAMETHASONE SODIUM PHOSPHATE 10 MG/ML IJ SOLN
10.0000 mg | Freq: Once | INTRAMUSCULAR | Status: AC
  Administered 2019-09-17: 10 mg via INTRAMUSCULAR
  Filled 2019-09-17: qty 1

## 2019-09-17 MED ORDER — FAMOTIDINE 20 MG PO TABS: 20.0000 mg | ORAL_TABLET | Freq: Two times a day (BID) | ORAL | 0 refills | Status: DC

## 2019-09-17 MED ORDER — FAMOTIDINE 20 MG PO TABS
20.0000 mg | ORAL_TABLET | Freq: Once | ORAL | Status: AC
  Administered 2019-09-17: 20 mg via ORAL
  Filled 2019-09-17: qty 1

## 2019-09-17 MED ORDER — DIPHENHYDRAMINE HCL 25 MG PO TABS: 25.0000 mg | ORAL_TABLET | Freq: Four times a day (QID) | ORAL | 0 refills | Status: DC

## 2019-09-17 MED ORDER — PREDNISONE 10 MG (21) PO TBPK: ORAL_TABLET | ORAL | 0 refills | Status: DC

## 2019-09-17 NOTE — ED Triage Notes (Signed)
Pt c/o rash and facial swelling since Saturday. Took Benadryl yesterday but none today. Denies any new products or foods.

## 2019-09-17 NOTE — Discharge Instructions (Addendum)
Allergic Reaction Instructions:  Benadryl: Take 25 mg of Benadryl every 6 hours for the next 24 hours.  Use caution as Benadryl can make you drowsy. Pepcid: Take the Pepcid, as prescribed, over the next 3 days. Prednisone: Take prednisone, as prescribed, until finished. EpiPen: You have been prescribed an EpiPen to be used in the case of anaphylaxis. Please see the attached sheets regarding the symptoms that would cause you to have to use the EpiPen. If you have to use the EpiPen, you should always come to the emergency room immediately.  Follow-up with your primary care provider on this matter.  Allergy testing with an allergist may be warranted.  Return to the ED for worsening symptoms, shortness of breath, chest pain, palpitations, persistent vomiting, facial or throat swelling, or any other major concerns.

## 2019-09-17 NOTE — ED Provider Notes (Signed)
[facility]-EMERGENCY DEPT Provider Note   CSN: [id number] Arrival date & time: 09/17/19  1026     History Chief Complaint  Patient presents with  . Rash  . Facial Swelling    Elaine Hawkins is a 49 y.o. female.  HPI      Elaine Hawkins is a 49 y.o. female, with a history of rashes, presenting to the ED with rash to her face for the last 3 days.  Patient states she woke up with erythematous, pruritic rash accompanied by swelling to most of her face. She took Benadryl yesterday and much of the swelling resolved. She states she has not had this issue before on her face, however, she does note she has had pruritic rashes to other areas of her body for which she has had to undergo steroid therapy.  Denies any new products or exposures.  Denies fever/chills, intraoral swelling, throat swelling, drooling, difficulty swallowing, shortness of breath, cough, chest pain, N/V/D, joint pain/swelling, headaches, dizziness, syncope, or any other complaints.  Past Medical History:  Diagnosis Date  . Medical history non-contributory   . Rash     Patient Active Problem List   Diagnosis Date Noted  . Vulvar itching 01/16/2018    Past Surgical History:  Procedure Laterality Date  . CESAREAN SECTION    . OOPHORECTOMY     L ovary  . TUBAL LIGATION       OB History    Gravida  2   Para  2   Term  2   Preterm  0   AB  0   Living  2     SAB  0   TAB  0   Ectopic  0   Multiple  0   Live Births  2           No family history on file.  Social History   Tobacco Use  . Smoking status: Never Smoker  . Smokeless tobacco: Never Used  Substance Use Topics  . Alcohol use: Never  . Drug use: Never    Home Medications Prior to Admission medications   Medication Sig Start Date End Date Taking? Authorizing Provider  calcium-vitamin D (OSCAL WITH D) 250-125 MG-UNIT tablet Take 1 tablet by mouth daily.    [provider]  Clobetasol  Prop Emollient Base (CLOBETASOL PROPIONATE E) 0.05 % emollient cream Apply 1 application topically 1 day or 1 dose. Use tid x 3-4 wks, then drop to bid x 3-4 wks, then drop to daily x 3-4 wks, then drop to qod 01/16/18   Reva Bores, MD  diphenhydrAMINE (BENADRYL) 25 MG tablet Take 1 tablet (25 mg total) by mouth every 6 (six) hours for 1 day. 09/17/19 09/18/19  Aaira Oestreicher C, PA-C  doxycycline (VIBRAMYCIN) 100 MG capsule Take 1 capsule (100 mg total) by mouth 2 (two) times daily. 07/22/19   Cristina Gong, PA-C  EPINEPHrine (EPIPEN 2-PAK) 0.3 mg/0.3 mL IJ SOAJ injection Inject 0.3 mLs (0.3 mg total) into the muscle as needed for anaphylaxis. 09/17/19   Amna Welker C, PA-C  famotidine (PEPCID) 20 MG tablet Take 1 tablet (20 mg total) by mouth 2 (two) times daily for 3 days. 09/17/19 09/20/19  Shanica Castellanos C, PA-C  metroNIDAZOLE (FLAGYL) 500 MG tablet Take 1 tablet (500 mg total) by mouth 2 (two) times daily. 11/30/18   Arvilla Market, DO  predniSONE (STERAPRED UNI-PAK 21 TAB) 10 MG (21) TBPK tablet Take 6 tabs (60mg )  day 1, 5 tabs (50mg ) day 2, 4 tabs (40mg ) day 3, 3 tabs (30mg ) day 4, 2 tabs (20mg ) day 5, and 1 tab (10mg ) day 6. 09/17/19   Heydi Swango C, PA-C  Skin Protectants, Misc. (CERAVE) OINT Apply 1 application topically daily as needed (dry skin and itching). 02/08/18   Katheren Shams, DO    Allergies    Patient has no known allergies.  Review of Systems   Review of Systems  Constitutional: Negative for chills, diaphoresis and fever.  HENT: Positive for facial swelling. Negative for drooling, rhinorrhea, trouble swallowing and voice change.   Respiratory: Negative for cough and shortness of breath.   Gastrointestinal: Negative for abdominal pain, diarrhea, nausea and vomiting.  Musculoskeletal: Negative for arthralgias, joint swelling, myalgias, neck pain and neck stiffness.  Skin: Positive for rash.  Neurological: Negative for dizziness, syncope, weakness and headaches.  All other  systems reviewed and are negative.   Physical Exam Updated Vital Signs BP 119/62 (BP Location: Left Arm)   Pulse 88   Temp 97.9 F (36.6 C) (Oral)   Resp 15   Ht 5' (1.524 m)   Wt 54.4 kg   LMP 09/13/2019   SpO2 100%   BMI 23.44 kg/m   Physical Exam Vitals and nursing note reviewed.  Constitutional:      General: She is not in acute distress.    Appearance: She is well-developed. She is not diaphoretic.  HENT:     Head: Normocephalic and atraumatic.     Comments: Scattered erythema with mild edema to some areas of the patient's face.  No pustules or vesicles noted. No intraoral/perioral swelling or lesions.  No phonation abnormality or difficulty speaking noted.  Handles oral secretions without difficulty.  Able to open her mouth to at least three finger widths.    Mouth/Throat:     Mouth: Mucous membranes are moist.     Pharynx: Oropharynx is clear.  Eyes:     Conjunctiva/sclera: Conjunctivae normal.  Cardiovascular:     Rate and Rhythm: Normal rate and regular rhythm.     Pulses: Normal pulses.          Radial pulses are 2+ on the right side and 2+ on the left side.       Posterior tibial pulses are 2+ on the right side and 2+ on the left side.     Comments: Tactile temperature in the extremities appropriate and equal bilaterally. Pulmonary:     Effort: Pulmonary effort is normal. No respiratory distress.     Breath sounds: Normal breath sounds.  Abdominal:     Tenderness: There is no guarding.  Musculoskeletal:     Cervical back: Normal range of motion and neck supple. No rigidity.     Right lower leg: No edema.     Left lower leg: No edema.  Lymphadenopathy:     Cervical: No cervical adenopathy.  Skin:    General: Skin is warm and dry.  Neurological:     Mental Status: She is alert.  Psychiatric:        Mood and Affect: Mood and affect normal.        Speech: Speech normal.        Behavior: Behavior normal.               ED Results / Procedures  / Treatments   Labs (all labs ordered are listed, but only abnormal results are displayed) Labs Reviewed - No data to display  EKG  None  Radiology No results found.  Procedures Procedures (including critical care time)  Medications Ordered in ED Medications  dexamethasone (DECADRON) injection 10 mg (10 mg Intramuscular Given 09/17/19 1209)  famotidine (PEPCID) tablet 20 mg (20 mg Oral Given 09/17/19 1210)    ED Course  I have reviewed the triage vital signs and the nursing notes.  Pertinent labs & imaging results that were available during my care of the patient were reviewed by me and considered in my medical decision making (see chart for details).    MDM Rules/Calculators/A&P                      Patient presents with rash to the face with some edema.  Patient is nontoxic appearing, afebrile, not tachycardic, not tachypneic, not hypotensive, excellent SPO2 on room air, and is in no apparent distress.   Low suspicion for anaphylaxis.  We will treat with medications for allergic reaction.  Patient has a dermatologist with whom she can follow-up. The patient was given instructions for home care as well as return precautions. Patient voices understanding of these instructions, accepts the plan, and is comfortable with discharge.    Final Clinical Impression(s) / ED Diagnoses Final diagnoses:  Facial rash    Rx / DC Orders ED Discharge Orders         Ordered    predniSONE (STERAPRED UNI-PAK 21 TAB) 10 MG (21) TBPK tablet     09/17/19 1236    diphenhydrAMINE (BENADRYL) 25 MG tablet  Every 6 hours     09/17/19 1236    famotidine (PEPCID) 20 MG tablet  2 times daily     09/17/19 1236    EPINEPHrine (EPIPEN 2-PAK) 0.3 mg/0.3 mL IJ SOAJ injection  As needed     09/17/19 1236           Concepcion Living 09/17/19 2153    Jacalyn Lefevre, MD 09/18/19 1635

## 2019-09-17 NOTE — ED Notes (Signed)
Pt refused discharge vital signs

## 2019-09-29 ENCOUNTER — Emergency Department (HOSPITAL_COMMUNITY)
Admission: EM | Admit: 2019-09-29 | Discharge: 2019-09-29 | Disposition: A | Discharge status: Home / Self Care | Payer: Medicaid Other | Attending: Emergency Medicine | Admitting: Emergency Medicine

## 2019-09-29 ENCOUNTER — Other Ambulatory Visit: Payer: Self-pay

## 2019-09-29 ENCOUNTER — Encounter (HOSPITAL_COMMUNITY): Payer: Self-pay | Admitting: Emergency Medicine

## 2019-09-29 DIAGNOSIS — R300 Dysuria: Secondary | ICD-10-CM | POA: Diagnosis not present

## 2019-09-29 LAB — URINALYSIS, ROUTINE W REFLEX MICROSCOPIC
Bacteria, UA: NONE SEEN
Bilirubin Urine: NEGATIVE
Glucose, UA: NEGATIVE mg/dL
Hgb urine dipstick: NEGATIVE
Ketones, ur: NEGATIVE mg/dL
Nitrite: NEGATIVE
Protein, ur: NEGATIVE mg/dL
Specific Gravity, Urine: 1.004 — ABNORMAL LOW (ref 1.005–1.030)
WBC, UA: >50 WBC/hpf — ABNORMAL HIGH (ref 0–5)
pH: 7 (ref 5.0–8.0)

## 2019-09-29 NOTE — Discharge Instructions (Addendum)
Today your urine did not look infected.  You have a large amount of white blood cells in your urine but no bacteria seen.  This can be seen with irration of the urinary tract from causes other than bacterial infection.  Your urine has been sent for culture.  If it is infected and you need antibiotics you will be contacted.  Please make sure you are drinking plenty of fluids.     You can take Azo up to 3 times a day for 2 days to help relieve symptoms.    If you develop fevers, flank pain, noticed blood in your urine, your symptoms become worse, or you have other concerns please seek additional medical care and evaluation.

## 2019-09-29 NOTE — ED Provider Notes (Signed)
[facility]-EMERGENCY DEPT Provider Note   CSN: [id number] Arrival date & time: 09/29/19  1141     History Chief Complaint  Patient presents with  . Dysuria    Elaine Hawkins is a 49 y.o. female with past medical history of bilateral tubal ligation who presents today for evaluation of dysuria. She reports that over the past 2 to 3 days she has had urinary frequency, urgency, and dysuria.  She denies any fevers or flank pain.  No nausea, vomiting, or diarrhea.  Her last urinary tract infection was over 1 year ago and she states it felt similar.  She took Azo once which provided temporary relief.  HPI     Past Medical History:  Diagnosis Date  . Medical history non-contributory   . Rash     Patient Active Problem List   Diagnosis Date Noted  . Vulvar itching 01/16/2018    Past Surgical History:  Procedure Laterality Date  . CESAREAN SECTION    . OOPHORECTOMY     L ovary  . TUBAL LIGATION       OB History    Gravida  2   Para  2   Term  2   Preterm  0   AB  0   Living  2     SAB  0   TAB  0   Ectopic  0   Multiple  0   Live Births  2           No family history on file.  Social History   Tobacco Use  . Smoking status: Never Smoker  . Smokeless tobacco: Never Used  Substance Use Topics  . Alcohol use: Never  . Drug use: Never    Home Medications Prior to Admission medications   Medication Sig Start Date End Date Taking? Authorizing Provider  calcium-vitamin D (OSCAL WITH D) 250-125 MG-UNIT tablet Take 1 tablet by mouth daily.    [provider]  Clobetasol Prop Emollient Base (CLOBETASOL PROPIONATE E) 0.05 % emollient cream Apply 1 application topically 1 day or 1 dose. Use tid x 3-4 wks, then drop to bid x 3-4 wks, then drop to daily x 3-4 wks, then drop to qod 01/16/18   Reva Bores, MD  diphenhydrAMINE (BENADRYL) 25 MG tablet Take 1 tablet (25 mg total) by mouth every 6 (six) hours for 1 day. 09/17/19  09/18/19  Joy, Shawn C, PA-C  doxycycline (VIBRAMYCIN) 100 MG capsule Take 1 capsule (100 mg total) by mouth 2 (two) times daily. 07/22/19   Cristina Gong, PA-C  EPINEPHrine (EPIPEN 2-PAK) 0.3 mg/0.3 mL IJ SOAJ injection Inject 0.3 mLs (0.3 mg total) into the muscle as needed for anaphylaxis. 09/17/19   Joy, Shawn C, PA-C  famotidine (PEPCID) 20 MG tablet Take 1 tablet (20 mg total) by mouth 2 (two) times daily for 3 days. 09/17/19 09/20/19  Joy, Shawn C, PA-C  metroNIDAZOLE (FLAGYL) 500 MG tablet Take 1 tablet (500 mg total) by mouth 2 (two) times daily. 11/30/18   Arvilla Market, DO  predniSONE (STERAPRED UNI-PAK 21 TAB) 10 MG (21) TBPK tablet Take 6 tabs (60mg ) day 1, 5 tabs (50mg ) day 2, 4 tabs (40mg ) day 3, 3 tabs (30mg ) day 4, 2 tabs (20mg ) day 5, and 1 tab (10mg ) day 6. 09/17/19   Joy, Shawn C, PA-C  Skin Protectants, Misc. (CERAVE) OINT Apply 1 application topically daily as needed (dry skin and itching). 02/08/18    Y, DO    Allergies    Patient has no known allergies.  Review of Systems   Review of Systems  Constitutional: Negative for chills and fever.  HENT: Negative for congestion.   Respiratory: Negative for cough, chest tightness and shortness of breath.   Cardiovascular: Negative for chest pain.  Gastrointestinal: Negative for abdominal pain, diarrhea, nausea and vomiting.  Genitourinary: Positive for dysuria, frequency and urgency. Negative for difficulty urinating, flank pain and menstrual problem.  Musculoskeletal: Negative for back pain and neck pain.  Neurological: Negative for weakness and headaches.  All other systems reviewed and are negative.   Physical Exam Updated Vital Signs BP (!) 122/58 (BP Location: Right Arm)   Pulse (!) 104   Temp 98.7 F (37.1 C) (Oral)   Resp 14   Ht 5' (1.524 m)   Wt 54.4 kg   LMP 09/13/2019   SpO2 98%   BMI 23.44 kg/m   Physical Exam Vitals and nursing note reviewed.  Constitutional:      General: She is  not in acute distress.    Appearance: She is not ill-appearing.  HENT:     Head: Normocephalic.  Cardiovascular:     Rate and Rhythm: Normal rate.  Pulmonary:     Effort: Pulmonary effort is normal. No respiratory distress.  Abdominal:     General: Abdomen is flat.     Palpations: Abdomen is soft.     Tenderness: There is no right CVA tenderness or left CVA tenderness.  Neurological:     Mental Status: She is alert.  Psychiatric:        Mood and Affect: Mood normal.     ED Results / Procedures / Treatments   Labs (all labs ordered are listed, but only abnormal results are displayed) Labs Reviewed  URINALYSIS, ROUTINE W REFLEX MICROSCOPIC - Abnormal; Notable for the following components:      Result Value   Color, Urine STRAW (*)    Specific Gravity, Urine 1.004 (*)    Leukocytes,Ua LARGE (*)    WBC, UA >50 (*)    All other components within normal limits  URINE CULTURE    EKG None  Radiology No results found.  Procedures Procedures (including critical care time)  Medications Ordered in ED Medications - No data to display  ED Course  I have reviewed the triage vital signs and the nursing notes.  Pertinent labs & imaging results that were available during my care of the patient were reviewed by me and considered in my medical decision making (see chart for details).    MDM Rules/Calculators/A&P                     Elaine Hawkins presents today for evaluation of 2 to 3 days of dysuria, increased urinary frequency and urgency. Here she is generally well-appearing, afebrile, not consistently tachycardic or tachypneic.  Clean-catch urine with microscopy exam showing 0-5 red cells, over 50 white cells with no bacteria and 0-5 squamous epithelial cells.  While she has evidence of pyuria, which can be irritating, she does not have evidence of bacterial infection.  We discussed possible causes for her symptoms including irritation, she denies any changes in products  or exposures.  Urine will be sent for culture.  Given that there were no bacteria seen will await culture.  If culture is equivocal or has evidence of bacteria I would have a low threshold to recommend treatment.  She does not have any CVA  pain, do not suspect pyelonephritis.  No red cells, no flank pain, and overall symptoms are not consistent with nephrolithiasis.  Return precautions were discussed with patient who states their understanding.  At the time of discharge patient denied any unaddressed complaints or concerns.  Patient is agreeable for discharge home.  She is aware that she has a urine culture pending, and that if she needs antibiotics based on the culture she will receive a call.  Note: Portions of this report may have been transcribed using voice recognition software. Every effort was made to ensure accuracy; however, inadvertent computerized transcription errors may be present  Final Clinical Impression(s) / ED Diagnoses Final diagnoses:  Dysuria    Rx / DC Orders ED Discharge Orders    None       Ollen Gross 09/29/19 1327    Virgel Manifold, MD 09/29/19 [phone]

## 2019-09-29 NOTE — ED Triage Notes (Signed)
Per pt, states dysuria for 2-3 days-states frequency, burning-history of UTI's

## 2019-10-01 LAB — URINE CULTURE: Culture: 80000 — AB

## 2019-10-02 ENCOUNTER — Telehealth: Payer: Self-pay | Admitting: Emergency Medicine

## 2019-10-02 NOTE — Telephone Encounter (Signed)
Post ED Visit - Positive Culture Follow-up: Successful Patient Follow-Up  Culture assessed and recommendations reviewed by:  []  , Pharm.D. []  Enzo Bi, Pharm.D., BCPS AQ-ID []  , Pharm.D., BCPS []  Celedonio Miyamoto, Pharm.D., BCPS []  Curran, Garvin Fila.D., BCPS, AAHIVP []  , Pharm.D., BCPS, AAHIVP []  Georgina Pillion, PharmD, BCPS []  , PharmD, BCPS []  [place], PharmD, BCPS []  [place], PharmD  Positive urine culture  [x]  Patient discharged without antimicrobial prescription and treatment is now indicated []  Organism is resistant to prescribed ED discharge antimicrobial []  Patient with positive blood cultures  Changes discussed with ED provider: PA New antibiotic prescription start Keflex 500mg  po qid x 7 days Called to [facility] [phone]  Contacted patient   Elaine Hawkins 10/02/2019, 12:49 PM

## 2019-10-02 NOTE — Progress Notes (Signed)
ED Antimicrobial Stewardship Positive Culture Follow Up   Elaine Hawkins is an 49 y.o. female who presented to [facility] on 09/29/2019 with a chief complaint of  Chief Complaint  Patient presents with  . Dysuria    Recent Results (from the past 720 hour(s))  Urine culture     Status: Abnormal   Collection Time: 09/29/19 11:52 AM   Specimen: Urine, Random  Result Value Ref Range Status   Specimen Description   Final    URINE, RANDOM Performed at [facility], [address] [place], [place] [address]    Special Requests   Final    NONE Performed at [facility], [address] [place], [place] [address]    Culture 80,000 COLONIES/mL ESCHERICHIA COLI (A)  Final   Report Status 10/01/2019 FINAL  Final   Organism ID, Bacteria ESCHERICHIA COLI (A)  Final      Susceptibility   Escherichia coli - MIC*    AMPICILLIN <=2 SENSITIVE Sensitive     CEFAZOLIN <=4 SENSITIVE Sensitive     CEFTRIAXONE <=0.25 SENSITIVE Sensitive     CIPROFLOXACIN <=0.25 SENSITIVE Sensitive     GENTAMICIN <=1 SENSITIVE Sensitive     IMIPENEM <=0.25 SENSITIVE Sensitive     NITROFURANTOIN <=16 SENSITIVE Sensitive     TRIMETH/SULFA <=20 SENSITIVE Sensitive     AMPICILLIN/SULBACTAM <=2 SENSITIVE Sensitive     PIP/TAZO <=4 SENSITIVE Sensitive     * 80,000 COLONIES/mL ESCHERICHIA COLI    []  Treated with none [x]  Patient discharged originally without antimicrobial agent and treatment is now indicated  New antibiotic prescription: Keflex 500mg  q6 x7 days ED Provider: , PA   10/02/2019, 4:08 PM Clinical Pharmacist [phone]

## 2019-12-09 ENCOUNTER — Encounter: Payer: Self-pay | Admitting: Nurse Practitioner

## 2019-12-09 DIAGNOSIS — U071 COVID-19: Secondary | ICD-10-CM | POA: Insufficient documentation

## 2019-12-10 ENCOUNTER — Ambulatory Visit: Payer: Medicaid Other | Admitting: Nurse Practitioner

## 2020-01-14 ENCOUNTER — Encounter: Payer: Self-pay | Admitting: Neurology

## 2020-04-01 NOTE — Progress Notes (Deleted)
NEUROLOGY CONSULTATION NOTE  Shakendra Griffeth MRN: [id number] DOB: [birth year]  Referring provider: Hillery Jacks, NP Primary care provider: Hillery Jacks, NP  Reason for consult:  Bilateral upper extremity numbness and tingling  HISTORY OF PRESENT ILLNESS: Elaine Hawkins T. [place] is a 49 year old ***-handed female who presents for bilateral upper extremity numbness and tingling.  History supplemented by referring provider's note.  ***  PAST MEDICAL HISTORY: Past Medical History:  Diagnosis Date  . Medical history non-contributory   . Rash     PAST SURGICAL HISTORY: Past Surgical History:  Procedure Laterality Date  . CESAREAN SECTION    . OOPHORECTOMY     L ovary  . TUBAL LIGATION      MEDICATIONS: Current Outpatient Medications on File Prior to Visit  Medication Sig Dispense Refill  . calcium-vitamin D (OSCAL WITH D) 250-125 MG-UNIT tablet Take 1 tablet by mouth daily.    . Clobetasol Prop Emollient Base (CLOBETASOL PROPIONATE E) 0.05 % emollient cream Apply 1 application topically 1 day or 1 dose. Use tid x 3-4 wks, then drop to bid x 3-4 wks, then drop to daily x 3-4 wks, then drop to qod 60 g 5  . diphenhydrAMINE (BENADRYL) 25 MG tablet Take 1 tablet (25 mg total) by mouth every 6 (six) hours for 1 day. 4 tablet 0  . doxycycline (VIBRAMYCIN) 100 MG capsule Take 1 capsule (100 mg total) by mouth 2 (two) times daily. 20 capsule 0  . EPINEPHrine (EPIPEN 2-PAK) 0.3 mg/0.3 mL IJ SOAJ injection Inject 0.3 mLs (0.3 mg total) into the muscle as needed for anaphylaxis. 1 each 1  . famotidine (PEPCID) 20 MG tablet Take 1 tablet (20 mg total) by mouth 2 (two) times daily for 3 days. 6 tablet 0  . metroNIDAZOLE (FLAGYL) 500 MG tablet Take 1 tablet (500 mg total) by mouth 2 (two) times daily. 14 tablet 0  . predniSONE (STERAPRED UNI-PAK 21 TAB) 10 MG (21) TBPK tablet Take 6 tabs (60mg ) day 1, 5 tabs (50mg ) day 2, 4 tabs (40mg ) day 3, 3 tabs (30mg ) day 4, 2 tabs (20mg ) day 5, and 1 tab (10mg )  day 6. 21 tablet 0  . Skin Protectants, Misc. (CERAVE) OINT Apply 1 application topically daily as needed (dry skin and itching). 144 g 1   No current facility-administered medications on file prior to visit.    ALLERGIES: No Known Allergies  FAMILY HISTORY: No family history on file. .  SOCIAL HISTORY: Social History   Socioeconomic History  . Marital status: Single    Spouse name: Not on file  . Number of children: Not on file  . Years of education: Not on file  . Highest education level: Not on file  Occupational History  . Not on file  Tobacco Use  . Smoking status: Never Smoker  . Smokeless tobacco: Never Used  Vaping Use  . Vaping Use: Never used  Substance and Sexual Activity  . Alcohol use: Never  . Drug use: Never  . Sexual activity: Yes    Birth control/protection: Surgical  Other Topics Concern  . Not on file  Social History Narrative  . Not on file   Social Determinants of Health   Financial Resource Strain:   . Difficulty of Paying Living Expenses:   Food Insecurity:   . Worried About in the Last Year:   . of Food in the Last Year:   Transportation Needs:   . Lack of  Transportation (Medical):   Marland Kitchen Lack of Transportation (Non-Medical):   Physical Activity:   . Days of Exercise per Week:   . Minutes of Exercise per Session:   Stress:   . Feeling of Stress :   Social Connections:   . Frequency of Communication with Friends and Family:   . Frequency of Social Gatherings with Friends and Family:   . Attends Religious Services:   . Active Member of Clubs or Organizations:   . Attends Banker Meetings:   Marland Kitchen Marital Status:   Intimate Partner Violence:   . Fear of Current or Ex-Partner:   . Emotionally Abused:   Marland Kitchen Physically Abused:   . Sexually Abused:     PHYSICAL EXAM: *** General: No acute distress.  Patient appears well-groomed.  Head:  Normocephalic/atraumatic Eyes:  fundi examined but not  visualized Neck: supple, no paraspinal tenderness, full range of motion Back: No paraspinal tenderness Heart: regular rate and rhythm Lungs: Clear to auscultation bilaterally. Vascular: No carotid bruits. Neurological Exam: Mental status: alert and oriented to person, place, and time, recent and remote memory intact, fund of knowledge intact, attention and concentration intact, speech fluent and not dysarthric, language intact. Cranial nerves: CN I: not tested CN II: pupils equal, round and reactive to light, visual fields intact CN III, IV, VI:  full range of motion, no nystagmus, no ptosis CN V: facial sensation intact CN VII: upper and lower face symmetric CN VIII: hearing intact CN IX, X: gag intact, uvula midline CN XI: sternocleidomastoid and trapezius muscles intact CN XII: tongue midline Bulk & Tone: normal, no fasciculations. Motor:  5/5 throughout *** Sensation:  Pinprick *** temperature *** and vibration sensation intact.  ***. Deep Tendon Reflexes:  2+ throughout, *** toes downgoing.  *** Finger to nose testing:  Without dysmetria.  *** Heel to shin:  Without dysmetria.  *** Gait:  Normal station and stride.  Able to turn and tandem walk. [place]berg ***.  IMPRESSION: ***  PLAN: ***  Thank you for allowing me to take part in the care of this patient.  Shon Millet, DO  CC: Hillery Jacks, NP

## 2020-04-03 ENCOUNTER — Ambulatory Visit: Payer: Medicaid Other | Admitting: Neurology

## 2020-05-22 ENCOUNTER — Other Ambulatory Visit: Payer: Self-pay | Admitting: Nurse Practitioner

## 2020-05-22 DIAGNOSIS — Z1231 Encounter for screening mammogram for malignant neoplasm of breast: Secondary | ICD-10-CM

## 2020-05-28 NOTE — Progress Notes (Signed)
NEUROLOGY CONSULTATION NOTE  Elaine Hawkins MRN: [id number] DOB: 1971/02/18  Referring provider: Hillery Jacks, NP Primary care provider: Hillery Jacks, NP  Reason for consult:  Bilateral upper extremity tingling and numbness  HISTORY OF PRESENT ILLNESS: Elaine Hawkins is a 49 year old righ-handed female who presents for bilateral upper extremity tingling and numbness.  History supplemented by referring provider's note.  For at least 6 months, she reports numbness in her hands up her forearms to the elbows.  It involves the entire hand and all fingers.  No neck pain or upper extremity weakness.  She notices it when she is holding something, such as her phone, or laying down in a particular position for at least 15 minutes.     PAST MEDICAL HISTORY: Past Medical History:  Diagnosis Date  . Medical history non-contributory   . Rash     PAST SURGICAL HISTORY: Past Surgical History:  Procedure Laterality Date  . CESAREAN SECTION    . OOPHORECTOMY     L ovary  . TUBAL LIGATION      MEDICATIONS: Current Outpatient Medications on File Prior to Visit  Medication Sig Dispense Refill  . calcium-vitamin D (OSCAL WITH D) 250-125 MG-UNIT tablet Take 1 tablet by mouth daily.    . Clobetasol Prop Emollient Base (CLOBETASOL PROPIONATE E) 0.05 % emollient cream Apply 1 application topically 1 day or 1 dose. Use tid x 3-4 wks, then drop to bid x 3-4 wks, then drop to daily x 3-4 wks, then drop to qod 60 g 5  . diphenhydrAMINE (BENADRYL) 25 MG tablet Take 1 tablet (25 mg total) by mouth every 6 (six) hours for 1 day. 4 tablet 0  . doxycycline (VIBRAMYCIN) 100 MG capsule Take 1 capsule (100 mg total) by mouth 2 (two) times daily. 20 capsule 0  . EPINEPHrine (EPIPEN 2-PAK) 0.3 mg/0.3 mL IJ SOAJ injection Inject 0.3 mLs (0.3 mg total) into the muscle as needed for anaphylaxis. 1 each 1  . famotidine (PEPCID) 20 MG tablet Take 1 tablet (20 mg total) by mouth 2 (two) times daily for 3 days. 6  tablet 0  . metroNIDAZOLE (FLAGYL) 500 MG tablet Take 1 tablet (500 mg total) by mouth 2 (two) times daily. 14 tablet 0  . predniSONE (STERAPRED UNI-PAK 21 TAB) 10 MG (21) TBPK tablet Take 6 tabs (60mg ) day 1, 5 tabs (50mg ) day 2, 4 tabs (40mg ) day 3, 3 tabs (30mg ) day 4, 2 tabs (20mg ) day 5, and 1 tab (10mg ) day 6. 21 tablet 0  . Skin Protectants, Misc. (CERAVE) OINT Apply 1 application topically daily as needed (dry skin and itching). 144 g 1   No current facility-administered medications on file prior to visit.    ALLERGIES: No Known Allergies  FAMILY HISTORY: History reviewed. No pertinent family history.   SOCIAL HISTORY: Social History   Socioeconomic History  . Marital status: Single    Spouse name: Not on file  . Number of children: Not on file  . Years of education: Not on file  . Highest education level: Not on file  Occupational History  . Not on file  Tobacco Use  . Smoking status: Never Smoker  . Smokeless tobacco: Never Used  Vaping Use  . Vaping Use: Never used  Substance and Sexual Activity  . Alcohol use: Never  . Drug use: Never  . Sexual activity: Yes    Birth control/protection: Surgical  Other Topics Concern  . Not on file  Social History  Narrative  . Not on file   Social Determinants of Health   Financial Resource Strain:   . Difficulty of Paying Living Expenses: Not on file  Food Insecurity:   . Worried About Programme researcher, broadcasting/film/video in the Last Year: Not on file  . Ran Out of Food in the Last Year: Not on file  Transportation Needs:   . Lack of Transportation (Medical): Not on file  . Lack of Transportation (Non-Medical): Not on file  Physical Activity:   . Days of Exercise per Week: Not on file  . Minutes of Exercise per Session: Not on file  Stress:   . Feeling of Stress : Not on file  Social Connections:   . Frequency of Communication with Friends and Family: Not on file  . Frequency of Social Gatherings with Friends and Family: Not on  file  . Attends Religious Services: Not on file  . Active Member of Clubs or Organizations: Not on file  . Attends Banker Meetings: Not on file  . Marital Status: Not on file  Intimate Partner Violence:   . Fear of Current or Ex-Partner: Not on file  . Emotionally Abused: Not on file  . Physically Abused: Not on file  . Sexually Abused: Not on file    PHYSICAL EXAM: Blood pressure 99/66, pulse 90, resp. rate 18, height 5' (1.524 m), weight 118 lb (53.5 kg), SpO2 96 %. General: No acute distress.  Patient appears well-groomed.  Head:  Normocephalic/atraumatic Eyes:  fundi examined but not visualized Neck: supple, no paraspinal tenderness, full range of motion Back: No paraspinal tenderness Heart: regular rate and rhythm Lungs: Clear to auscultation bilaterally. Vascular: No carotid bruits. Neurological Exam: Mental status: alert and oriented to person, place, and time, recent and remote memory intact, fund of knowledge intact, attention and concentration intact, speech fluent and not dysarthric, language intact. Cranial nerves: CN I: not tested CN II: pupils equal, round and reactive to light, visual fields intact CN III, IV, VI:  full range of motion, no nystagmus, no ptosis CN V: facial sensation intact CN VII: upper and lower face symmetric CN VIII: hearing intact CN IX, X: gag intact, uvula midline CN XI: sternocleidomastoid and trapezius muscles intact CN XII: tongue midline Bulk & Tone: normal, no fasciculations. Motor:  5/5 throughout Sensation:  Pinprick and vibration sensation intact. Deep Tendon Reflexes:  2+ throughout, toes downgoing.  Finger to nose testing:  Without dysmetria.  Heel to shin:  Without dysmetria.  Gait:  Normal station and stride.  Able to turn and tandem walk. Romberg negative. Tinel's and Phalen's sign at wrists negative. Tinel's sign at cubital tunnel positive.  IMPRESSION: Bilateral hand and forearm numbness.  Most likely  carpal tunnel syndrome.  Tinel's sign at the elbow was positive but I don't suspect ulnar neuropathy based on distribution of numbness.  PLAN: 1.  Advised to wear splints on both wrists. 2.  NCV-EMG upper extremities 3.  Further recommendation pending results.  Thank you for allowing me to take part in the care of this patient.  Shon Millet, DO  CC: Hillery Jacks, NP

## 2020-05-29 ENCOUNTER — Encounter: Payer: Self-pay | Admitting: Neurology

## 2020-05-29 ENCOUNTER — Ambulatory Visit (INDEPENDENT_AMBULATORY_CARE_PROVIDER_SITE_OTHER): Payer: Medicaid Other | Admitting: Neurology

## 2020-05-29 ENCOUNTER — Other Ambulatory Visit: Payer: Self-pay

## 2020-05-29 VITALS — BP 99/66 | HR 90 | Resp 18 | Ht 60.0 in | Wt 118.0 lb

## 2020-05-29 DIAGNOSIS — R200 Anesthesia of skin: Secondary | ICD-10-CM

## 2020-05-29 NOTE — Patient Instructions (Signed)
We will order a nerve conduction study of both arms. Buy wrist splints to wear on each wrist.  Carpal Tunnel Syndrome  Carpal tunnel syndrome is a condition that causes pain in your hand and arm. The carpal tunnel is a narrow area that is on the palm side of your wrist. Repeated wrist motion or certain diseases may cause swelling in the tunnel. This swelling can pinch the main nerve in the wrist (median nerve). What are the causes? This condition may be caused by:  Repeated wrist motions.  Wrist injuries.  Arthritis.  A sac of fluid (cyst) or abnormal growth (tumor) in the carpal tunnel.  Fluid buildup during pregnancy. Sometimes the cause is not known. What increases the risk? The following factors may make you more likely to develop this condition:  Having a job in which you move your wrist in the same way many times. This includes jobs like being a Midwife or a Conservation officer, nature.  Being a woman.  Having other health conditions, such as: ? Diabetes. ? Obesity. ? A thyroid gland that is not active enough (hypothyroidism). ? Kidney failure. What are the signs or symptoms? Symptoms of this condition include:  A tingling feeling in your fingers.  Tingling or a loss of feeling (numbness) in your hand.  Pain in your entire arm. This pain may get worse when you bend your wrist and elbow for a long time.  Pain in your wrist that goes up your arm to your shoulder.  Pain that goes down into your palm or fingers.  A weak feeling in your hands. You may find it hard to grab and hold items. You may feel worse at night. How is this diagnosed? This condition is diagnosed with a medical history and physical exam. You may also have tests, such as:  Electromyogram (EMG). This test checks the signals that the nerves send to the muscles.  Nerve conduction study. This test checks how well signals pass through your nerves.  Imaging tests, such as X-rays, ultrasound, and MRI. These tests check  for what might be the cause of your condition. How is this treated? This condition may be treated with:  Lifestyle changes. You will be asked to stop or change the activity that caused your problem.  Doing exercise and activities that make bones and muscles stronger (physical therapy).  Learning how to use your hand again (occupational therapy).  Medicines for pain and swelling (inflammation). You may have injections in your wrist.  A wrist splint.  Surgery. Follow these instructions at home: If you have a splint:  Wear the splint as told by your doctor. Remove it only as told by your doctor.  Loosen the splint if your fingers: ? Tingle. ? Lose feeling (become numb). ? Turn cold and blue.  Keep the splint clean.  If the splint is not waterproof: ? Do not let it get wet. ? Cover it with a watertight covering when you take a bath or a shower. Managing pain, stiffness, and swelling   If told, put ice on the painful area: ? If you have a removable splint, remove it as told by your doctor. ? Put ice in a plastic bag. ? Place a towel between your skin and the bag. ? Leave the ice on for 20 minutes, 2-3 times per day. General instructions  Take over-the-counter and prescription medicines only as told by your doctor.  Rest your wrist from any activity that may cause pain. If needed, talk with your boss  at work about changes that can help your wrist heal.  Do any exercises as told by your doctor, physical therapist, or occupational therapist.  Keep all follow-up visits as told by your doctor. This is important. Contact a doctor if:  You have new symptoms.  Medicine does not help your pain.  Your symptoms get worse. Get help right away if:  You have very bad numbness or tingling in your wrist or hand. Summary  Carpal tunnel syndrome is a condition that causes pain in your hand and arm.  It is often caused by repeated wrist motions.  Lifestyle changes and medicines  are used to treat this problem. Surgery may help in very bad cases.  Follow your doctor's instructions about wearing a splint, resting your wrist, keeping follow-up visits, and calling for help. This information is not intended to replace advice given to you by your health care provider. Make sure you discuss any questions you have with your health care provider. Document Revised: 01/06/2018 Document Reviewed: 01/06/2018 Elsevier Patient Education  2020 [employer].

## 2020-06-24 ENCOUNTER — Ambulatory Visit: Payer: Medicaid Other

## 2020-06-25 ENCOUNTER — Encounter: Payer: Medicaid Other | Admitting: Neurology

## 2020-07-01 ENCOUNTER — Ambulatory Visit: Payer: Medicaid Other | Admitting: Neurology

## 2020-07-14 ENCOUNTER — Other Ambulatory Visit: Payer: Self-pay

## 2020-07-14 ENCOUNTER — Ambulatory Visit
Admission: RE | Admit: 2020-07-14 | Discharge: 2020-07-14 | Disposition: A | Discharge status: Home / Self Care | Payer: Medicaid Other | Source: Ambulatory Visit | Attending: Nurse Practitioner | Admitting: Nurse Practitioner

## 2020-07-14 DIAGNOSIS — Z1231 Encounter for screening mammogram for malignant neoplasm of breast: Secondary | ICD-10-CM

## 2020-07-21 ENCOUNTER — Other Ambulatory Visit: Payer: Self-pay | Admitting: Nurse Practitioner

## 2020-07-21 DIAGNOSIS — R928 Other abnormal and inconclusive findings on diagnostic imaging of breast: Secondary | ICD-10-CM

## 2020-08-04 ENCOUNTER — Other Ambulatory Visit: Payer: Medicaid Other

## 2020-08-15 ENCOUNTER — Other Ambulatory Visit: Payer: Self-pay

## 2020-08-15 ENCOUNTER — Ambulatory Visit: Payer: Medicaid Other

## 2020-08-15 ENCOUNTER — Ambulatory Visit
Admission: RE | Admit: 2020-08-15 | Discharge: 2020-08-15 | Disposition: A | Discharge status: Home / Self Care | Payer: Medicaid Other | Source: Ambulatory Visit | Attending: Nurse Practitioner | Admitting: Nurse Practitioner

## 2020-08-15 DIAGNOSIS — R928 Other abnormal and inconclusive findings on diagnostic imaging of breast: Secondary | ICD-10-CM

## 2020-08-29 ENCOUNTER — Encounter: Payer: Self-pay | Admitting: Gastroenterology

## 2020-10-08 ENCOUNTER — Ambulatory Visit (AMBULATORY_SURGERY_CENTER): Payer: Medicaid Other | Admitting: *Deleted

## 2020-10-08 ENCOUNTER — Other Ambulatory Visit: Payer: Self-pay

## 2020-10-08 VITALS — Ht 60.0 in | Wt 115.0 lb

## 2020-10-08 DIAGNOSIS — Z01818 Encounter for other preprocedural examination: Secondary | ICD-10-CM

## 2020-10-08 DIAGNOSIS — Z1211 Encounter for screening for malignant neoplasm of colon: Secondary | ICD-10-CM

## 2020-10-08 MED ORDER — SUPREP BOWEL PREP KIT 17.5-3.13-1.6 GM/177ML PO SOLN: 1.0000 | Freq: Once | ORAL | 0 refills | Status: AC

## 2020-10-08 NOTE — Progress Notes (Signed)
Pt verified name, DOB, address and insurance during PV today. Pt mailed instruction packet to included paper to complete and mail back to [facility] with addressed and stamped envelope, Emmi video, copy of consent form to read and not return, and instructions. PV completed over the phone. Pt encouraged to call with questions or issues - My Chart instructions to pt as well as mailed   No egg or soy allergy known to patient  No issues with past sedation with any surgeries or procedures No intubation problems in the past  No FH of Malignant Hyperthermia No diet pills per patient No home 02 use per patient  No blood thinners per patient  Pt denies issues with constipation  No A fib or A flutter  EMMI video to pt mailed and  MyChart  COVID 19 guidelines implemented in PV today with Pt and RN  Pt is not  fully vaccinated  for Covid - covid test 2-4 at 10 am   Due to the COVID-19 pandemic we are asking patients to follow certain guidelines.  Pt aware of COVID protocols and [facility] guidelines

## 2020-10-10 LAB — GLUCOSE, POCT (MANUAL RESULT ENTRY): POC Glucose: 111 mg/dl — AB (ref 70–99)

## 2020-10-20 ENCOUNTER — Telehealth: Payer: Self-pay

## 2020-10-20 NOTE — Telephone Encounter (Signed)
Pt was scheduled to see Dr. Russella Dar on 10/22/20 at 11am for a colonoscopy. Pt no showed her covid test scheduled for 10/17/20. Spoke with pt she stated that she has a lot going on because she had a family emergency. Stated she will call office back to reschedule procedure in the spring.

## 2020-10-22 ENCOUNTER — Encounter: Payer: Medicaid Other | Admitting: Gastroenterology

## 2021-12-17 ENCOUNTER — Ambulatory Visit
Admission: RE | Admit: 2021-12-17 | Discharge: 2021-12-17 | Disposition: A | Discharge status: Home / Self Care | Payer: Medicaid Other | Source: Ambulatory Visit | Attending: Family Medicine | Admitting: Family Medicine

## 2021-12-17 ENCOUNTER — Other Ambulatory Visit: Payer: Self-pay | Admitting: Family Medicine

## 2021-12-17 DIAGNOSIS — Z1231 Encounter for screening mammogram for malignant neoplasm of breast: Secondary | ICD-10-CM

## 2022-05-05 ENCOUNTER — Encounter (HOSPITAL_COMMUNITY): Payer: Self-pay | Admitting: *Deleted

## 2022-05-05 ENCOUNTER — Ambulatory Visit (HOSPITAL_COMMUNITY)
Admission: EM | Admit: 2022-05-05 | Discharge: 2022-05-05 | Disposition: A | Discharge status: Home / Self Care | Payer: Medicaid Other | Attending: Family Medicine | Admitting: Family Medicine

## 2022-05-05 DIAGNOSIS — R42 Dizziness and giddiness: Secondary | ICD-10-CM

## 2022-05-05 LAB — POCT URINALYSIS DIPSTICK, ED / UC
Bilirubin Urine: NEGATIVE
Glucose, UA: NEGATIVE mg/dL
Hgb urine dipstick: NEGATIVE
Ketones, ur: NEGATIVE mg/dL
Leukocytes,Ua: NEGATIVE
Nitrite: NEGATIVE
Protein, ur: NEGATIVE mg/dL
Specific Gravity, Urine: 1.02 (ref 1.005–1.030)
Urobilinogen, UA: 0.2 mg/dL (ref 0.0–1.0)
pH: 5.5 (ref 5.0–8.0)

## 2022-05-05 LAB — CBG MONITORING, ED: Glucose-Capillary: 112 mg/dL — ABNORMAL HIGH (ref 70–99)

## 2022-05-05 MED ORDER — MECLIZINE HCL 25 MG PO TABS: 25.0000 mg | ORAL_TABLET | Freq: Three times a day (TID) | ORAL | 0 refills | Status: AC | PRN

## 2022-05-05 NOTE — ED Provider Notes (Signed)
Elaine Hawkins    CSN: [id number] Arrival date & time: 05/05/22  1424      History   Chief Complaint Chief Complaint  Patient presents with   Dizziness    HPI Elaine Hawkins is a 51 y.o. female.   HPI Patient present dizzness x 3 days. Patient reports a distant history of vertigo requiring hospitalization several years ago. She took one dose of meclizine which was old last night and reports symptoms did not improve. Endorses an episode while at work yesterday in which she felt the room was spinning and she was very nauseous. Denies any URI symptoms. She reports dizziness remains present today, although has improved since yesterday. Tolerating oral intake of food and beverages. Denies any unilateral weakness, facial drooping, and headache. Past Medical History:  Diagnosis Date   Allergy    COVID-19 virus infection    07-2019   Hyperlipidemia    was on Simvastatin- but off currently    Medical history non-contributory    Rash     Patient Active Problem List   Diagnosis Date Noted   Lab test positive for detection of COVID-19 virus 12/09/2019   Vulvar itching 01/16/2018    Past Surgical History:  Procedure Laterality Date   CESAREAN SECTION     x2-2001,2003   OOPHORECTOMY     L ovary   TUBAL LIGATION      OB History     Gravida  2   Para  2   Term  2   Preterm  0   AB  0   Living  2      SAB  0   IAB  0   Ectopic  0   Multiple  0   Live Births  2            Home Medications    Prior to Admission medications   Medication Sig Start Date End Date Taking? Authorizing Provider  cholecalciferol (VITAMIN D3) 25 MCG (1000 UNIT) tablet Take 1,000 Units by mouth daily.   Yes [provider]  DUPIXENT 300 MG/2ML SOPN Inject into the skin. 09/09/20  Yes [provider]  ezetimibe (ZETIA) 10 MG tablet Take 10 mg by mouth daily. 04/27/22  Yes [provider]  meclizine (ANTIVERT) 25 MG tablet Take 1 tablet (25 mg  total) by mouth 3 (three) times daily as needed for dizziness. 05/05/22  Yes Bing Neighbors, FNP  Ascorbic Acid (VITAMIN C) 1000 MG tablet Take 1,000 mg by mouth daily.    [provider]  calcium-vitamin D (OSCAL WITH D) 250-125 MG-UNIT tablet Take 1 tablet by mouth daily.    [provider]  EPINEPHrine (EPIPEN 2-PAK) 0.3 mg/0.3 mL IJ SOAJ injection Inject 0.3 mLs (0.3 mg total) into the muscle as needed for anaphylaxis. Patient not taking: Reported on 05/29/2020 09/17/19   Joy, Ines Bloomer C, PA-C  Multiple Vitamins-Minerals (HAIR SKIN AND NAILS FORMULA PO) Take by mouth.    [provider]  Multiple Vitamins-Minerals (MULTIVITAMIN WOMENS 50+ ADV PO) Take by mouth.    [provider]    Family History Family History  Problem Relation Age of Onset   Hypercholesterolemia Mother    Liver cancer Father    Colon cancer Neg Hx    Colon polyps Neg Hx    Esophageal cancer Neg Hx    Rectal cancer Neg Hx    Stomach cancer Neg Hx     Social History Social History   Tobacco  Use   Smoking status: Never   Smokeless tobacco: Never  Vaping Use   Vaping Use: Never used  Substance Use Topics   Alcohol use: Never   Drug use: Never     Allergies   Patient has no known allergies.   Review of Systems Review of Systems Pertinent negatives listed in HPI   Physical Exam Triage Vital Signs ED Triage Vitals  Enc Vitals Group     BP 05/05/22 1455 96/65     Pulse Rate 05/05/22 1455 88     Resp 05/05/22 1455 18     Temp 05/05/22 1455 98.3 F (36.8 C)     Temp Source 05/05/22 1455 Oral     SpO2 05/05/22 1455 95 %     Weight --      Height --      Head Circumference --      Peak Flow --      Pain Score 05/05/22 1452 0     Pain Loc --      Pain Edu? --      Excl. in GC? --    No data found.  Updated Vital Signs BP 96/65 (BP Location: Left Arm)   Pulse 88   Temp 98.3 F (36.8 C) (Oral)   Resp 18   LMP 04/28/2022 (Approximate)   SpO2 95%    Visual Acuity Right Eye Distance:   Left Eye Distance:   Bilateral Distance:    Right Eye Near:   Left Eye Near:    Bilateral Near:     Physical Exam Constitutional:      Appearance: Normal appearance.  HENT:     Head: Normocephalic and atraumatic.     Nose: No congestion or rhinorrhea.  Eyes:     Extraocular Movements: Extraocular movements intact.     Conjunctiva/sclera: Conjunctivae normal.     Pupils: Pupils are equal, round, and reactive to light.  Cardiovascular:     Rate and Rhythm: Normal rate and regular rhythm.  Pulmonary:     Effort: Pulmonary effort is normal.     Breath sounds: Normal breath sounds.  Musculoskeletal:        General: Normal range of motion.     Cervical back: Normal range of motion.  Skin:    General: Skin is warm and dry.     Capillary Refill: Capillary refill takes less than 2 seconds.  Neurological:     General: No focal deficit present.     Mental Status: She is alert.     Motor: No weakness.     Coordination: Coordination normal.     Gait: Gait normal.  Psychiatric:        Mood and Affect: Mood normal.        Behavior: Behavior normal.        Thought Content: Thought content normal.        Judgment: Judgment normal.      UC Treatments / Results  Labs (all labs ordered are listed, but only abnormal results are displayed) Labs Reviewed  CBG MONITORING, ED - Abnormal; Notable for the following components:      Result Value   Glucose-Capillary 112 (*)    All other components within normal limits  POCT URINALYSIS DIPSTICK, ED / UC    EKG   Radiology No results found.  Procedures Procedures (including critical care time)  Medications Ordered in UC Medications - No data to display  Initial Impression / Assessment and Plan / UC Course  I have reviewed the triage vital signs and the nursing notes.  Pertinent labs & imaging results that were available during my care of the patient were reviewed by me and considered in  my medical decision making (see chart for details).     Dizziness, suspect vertigo vs mild dehydration given urine SPG is elevated. Glucose is 112 which is consistent with non-fasting blood sugar. Discussed a trial of meclizine and schedule follow-up with PCP if symptoms do not readily improve. Strict ER precautions given if symptoms worsen or becomes severe. Final Clinical Impressions(s) / UC Diagnoses   Final diagnoses:  Dizziness     Discharge Instructions      As discussed, if at any point your symptoms worsen or become more severe, develop nausea or visual changes these are indications to go to the nearest emergency department.       ED Prescriptions     Medication Sig Dispense Auth. Provider   meclizine (ANTIVERT) 25 MG tablet Take 1 tablet (25 mg total) by mouth 3 (three) times daily as needed for dizziness. 30 tablet Bing Neighbors, FNP      PDMP not reviewed this encounter.   Bing Neighbors, FNP 05/11/22 1319

## 2022-05-05 NOTE — ED Triage Notes (Signed)
Patient states that she has been dizzy 3-4 days without nausea. She does have a hx of vertigo. She does state that she has pressure in her T zone.

## 2022-05-05 NOTE — Discharge Instructions (Addendum)
As discussed, if at any point your symptoms worsen or become more severe, develop nausea or visual changes these are indications to go to the nearest emergency department.

## 2022-07-03 ENCOUNTER — Encounter (HOSPITAL_COMMUNITY): Payer: Self-pay

## 2022-07-03 ENCOUNTER — Ambulatory Visit (HOSPITAL_COMMUNITY)
Admission: EM | Admit: 2022-07-03 | Discharge: 2022-07-03 | Disposition: A | Discharge status: Home / Self Care | Payer: Medicaid Other | Attending: Internal Medicine | Admitting: Internal Medicine

## 2022-07-03 DIAGNOSIS — J029 Acute pharyngitis, unspecified: Secondary | ICD-10-CM | POA: Diagnosis present

## 2022-07-03 LAB — POCT RAPID STREP A, ED / UC: Streptococcus, Group A Screen (Direct): NEGATIVE

## 2022-07-03 MED ORDER — LIDOCAINE VISCOUS HCL 2 % MT SOLN: 15.0000 mL | Freq: Four times a day (QID) | OROMUCOSAL | 0 refills | Status: DC | PRN

## 2022-07-03 NOTE — Discharge Instructions (Signed)
Your strep was negative.  We will send this for culture and contact you if we need to start any antibiotics.  I am concerned that this might be a virus or your allergies.  I do recommend you begin your allergy medication regularly.  Gargle with warm salt water and use Tylenol/ibuprofen for pain.  I have also called in lidocaine that he can use up to 4 times a day.  Do not eat or drink immediately after using this medication as it can increase your risk of choking.  If your symptoms are not improving or if you develop any additional symptoms please return for reevaluation.  If anything worsens be seen immediately.

## 2022-07-03 NOTE — ED Provider Notes (Signed)
[facility]    CSN: [id number] Arrival date & time: 07/03/22  1025      History   Chief Complaint Chief Complaint  Patient presents with   Sore Throat    HPI Elaine Hawkins is a 51 y.o. female.   Patient presents today with a 1 day history of sore throat.  Reports that it is more of a discomfort than a pain and worse with swallowing.  Even water will exacerbate symptoms.  Pain is rated 3/4 nature to her pain scale, described as burning/scratchy, no alleviating factors identified.  She does have a history of allergies and has noted some increased congestion and sinus headaches but these are mild and at baseline.  Reports current sore throat symptoms are significantly worse than her typical allergy symptoms.  She does not take allergy medication on a regular basis.  Has not tried any over-the-counter medication for symptom management.  Denies any known sick contacts but does go to the hospital regularly so was exposed many illnesses.  Denies any recent antibiotics.  She has had COVID many years ago.  She has not had COVID-19 vaccines.    Past Medical History:  Diagnosis Date   Allergy    COVID-19 virus infection    07-2019   Hyperlipidemia    was on Simvastatin- but off currently    Medical history non-contributory    Rash     Patient Active Problem List   Diagnosis Date Noted   Lab test positive for detection of COVID-19 virus 12/09/2019   Vulvar itching 01/16/2018    Past Surgical History:  Procedure Laterality Date   CESAREAN SECTION     x2-2001,2003   OOPHORECTOMY     L ovary   TUBAL LIGATION      OB History     Gravida  2   Para  2   Term  2   Preterm  0   AB  0   Living  2      SAB  0   IAB  0   Ectopic  0   Multiple  0   Live Births  2            Home Medications    Prior to Admission medications   Medication Sig Start Date End Date Taking? Authorizing Provider  lidocaine (XYLOCAINE) 2 % solution Use as directed  15 mLs in the mouth or throat every 6 (six) hours as needed for mouth pain. 07/03/22  Yes Arnetia Bronk, Noberto Retort, PA-C  Ascorbic Acid (VITAMIN C) 1000 MG tablet Take 1,000 mg by mouth daily.    [provider]  calcium-vitamin D (OSCAL WITH D) 250-125 MG-UNIT tablet Take 1 tablet by mouth daily.    [provider]  cholecalciferol (VITAMIN D3) 25 MCG (1000 UNIT) tablet Take 1,000 Units by mouth daily.    [provider]  DUPIXENT 300 MG/2ML SOPN Inject into the skin. 09/09/20   [provider]  EPINEPHrine (EPIPEN 2-PAK) 0.3 mg/0.3 mL IJ SOAJ injection Inject 0.3 mLs (0.3 mg total) into the muscle as needed for anaphylaxis. Patient not taking: Reported on 05/29/2020 09/17/19   Harolyn Rutherford C, PA-C  ezetimibe (ZETIA) 10 MG tablet Take 10 mg by mouth daily. 04/27/22   [provider]  meclizine (ANTIVERT) 25 MG tablet Take 1 tablet (25 mg total) by mouth 3 (three) times daily as needed for dizziness. 05/05/22   Bing Neighbors, FNP  Multiple Vitamins-Minerals [facility] SKIN AND NAILS  FORMULA PO) Take by mouth.    [provider]  Multiple Vitamins-Minerals (MULTIVITAMIN WOMENS 50+ ADV PO) Take by mouth.    [provider]    Family History Family History  Problem Relation Age of Onset   Hypercholesterolemia Mother    Liver cancer Father    Colon cancer Neg Hx    Colon polyps Neg Hx    Esophageal cancer Neg Hx    Rectal cancer Neg Hx    Stomach cancer Neg Hx     Social History Social History   Tobacco Use   Smoking status: Never   Smokeless tobacco: Never  Vaping Use   Vaping Use: Never used  Substance Use Topics   Alcohol use: Never   Drug use: Never     Allergies   Patient has no known allergies.   Review of Systems Review of Systems  Constitutional:  Positive for activity change. Negative for appetite change, fatigue and fever.  HENT:  Positive for congestion, sore throat and trouble swallowing. Negative for sinus pressure,  sneezing and voice change.   Respiratory:  Negative for cough and shortness of breath.   Cardiovascular:  Negative for chest pain.  Gastrointestinal:  Negative for abdominal pain, diarrhea, nausea and vomiting.  Neurological:  Positive for headaches. Negative for dizziness and light-headedness.     Physical Exam Triage Vital Signs ED Triage Vitals  Enc Vitals Group     BP 07/03/22 1159 108/80     Pulse Rate 07/03/22 1159 69     Resp 07/03/22 1159 12     Temp 07/03/22 1159 98 F (36.7 C)     Temp Source 07/03/22 1159 Oral     SpO2 07/03/22 1159 98 %     Weight --      Height --      Head Circumference --      Peak Flow --      Pain Score 07/03/22 1157 0     Pain Loc --      Pain Edu? --      Excl. in [place]? --    No data found.  Updated Vital Signs BP 108/80 (BP Location: Left Arm)   Pulse 69   Temp 98 F (36.7 C) (Oral)   Resp 12   SpO2 98%   Visual Acuity Right Eye Distance:   Left Eye Distance:   Bilateral Distance:    Right Eye Near:   Left Eye Near:    Bilateral Near:     Physical Exam Vitals reviewed.  Constitutional:      General: She is awake. She is not in acute distress.    Appearance: Normal appearance. She is well-developed. She is not ill-appearing.     Comments: Very pleasant female appears stated age in no acute distress sitting comfortably in exam room  HENT:     Head: Normocephalic and atraumatic.     Right Ear: Tympanic membrane, ear canal and external ear normal. Tympanic membrane is not erythematous or bulging.     Left Ear: Tympanic membrane, ear canal and external ear normal. Tympanic membrane is not erythematous or bulging.     Nose:     Right Sinus: No maxillary sinus tenderness or frontal sinus tenderness.     Left Sinus: No maxillary sinus tenderness or frontal sinus tenderness.     Mouth/Throat:     Pharynx: Uvula midline. Posterior oropharyngeal erythema present. No oropharyngeal exudate.     Tonsils: No tonsillar exudate or  tonsillar  abscesses.     Comments: Significant erythema and drainage in posterior oropharynx Cardiovascular:     Rate and Rhythm: Normal rate and regular rhythm.     Heart sounds: Normal heart sounds, S1 normal and S2 normal. No murmur heard. Pulmonary:     Effort: Pulmonary effort is normal.     Breath sounds: Normal breath sounds. No wheezing, rhonchi or rales.     Comments: Clear to auscultation bilaterally Psychiatric:        Behavior: Behavior is cooperative.      UC Treatments / Results  Labs (all labs ordered are listed, but only abnormal results are displayed) Labs Reviewed  CULTURE, GROUP A STREP [facility])  POCT RAPID STREP A, ED / UC    EKG   Radiology No results found.  Procedures Procedures (including critical care time)  Medications Ordered in UC Medications - No data to display  Initial Impression / Assessment and Plan / UC Course  I have reviewed the triage vital signs and the nursing notes.  Pertinent labs & imaging results that were available during my care of the patient were reviewed by me and considered in my medical decision making (see chart for details).     Strep testing was negative.  Throat culture pending.  Will defer antibiotics until results are available.  Suspect viral etiology versus allergies.  Testing for COVID-19 was deferred as patient has no significant congestion symptoms.  Recommended that she use conservative treatment measures for symptom relief.  She is to gargle with warm salt water and alternate Tylenol ibuprofen.  Prescription for the viscous lidocaine was sent to pharmacy.  Discussed that she should not eat or drink immediately after using this medication as it increases her risk of choking.  She is to rest and drink plenty of fluid.  Discussed that if she has any changing or worsening symptoms she needs to be seen immediately.  Strict return precautions given.  Work excuse note provided.  Final Clinical Impressions(s) / UC  Diagnoses   Final diagnoses:  Pharyngitis, unspecified etiology  Sore throat     Discharge Instructions      Your strep was negative.  We will send this for culture and contact you if we need to start any antibiotics.  I am concerned that this might be a virus or your allergies.  I do recommend you begin your allergy medication regularly.  Gargle with warm salt water and use Tylenol/ibuprofen for pain.  I have also called in lidocaine that he can use up to 4 times a day.  Do not eat or drink immediately after using this medication as it can increase your risk of choking.  If your symptoms are not improving or if you develop any additional symptoms please return for reevaluation.  If anything worsens be seen immediately.     ED Prescriptions     Medication Sig Dispense Auth. Provider   lidocaine (XYLOCAINE) 2 % solution Use as directed 15 mLs in the mouth or throat every 6 (six) hours as needed for mouth pain. 100 mL Adisen Bennion K, PA-C      PDMP not reviewed this encounter.   Jeani Hawking, PA-C 07/03/22 1236

## 2022-07-03 NOTE — ED Triage Notes (Signed)
Pt is here for sore throat x 1 day

## 2022-07-05 LAB — CULTURE, GROUP A STREP (THRC)

## 2022-09-29 IMAGING — MG MM DIGITAL SCREENING BILAT W/ TOMO AND CAD
8 series · 9 of 24 positions shown · non-contrast
Comparison: Previous exam(s).

CLINICAL DATA: Screening.

EXAM:
DIGITAL SCREENING BILATERAL MAMMOGRAM WITH TOMOSYNTHESIS AND CAD
TECHNIQUE: Bilateral screening digital craniocaudal and mediolateral oblique
mammograms were obtained. Bilateral screening digital breast
tomosynthesis was performed. The images were evaluated with
computer-aided detection.

[R MLO synth-2D]
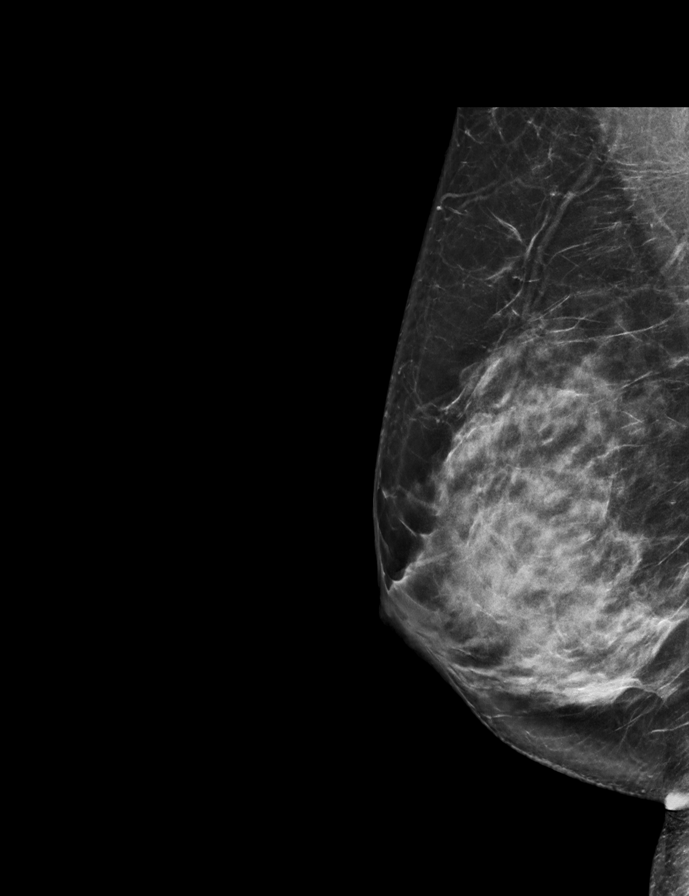

[R CC synth-2D]
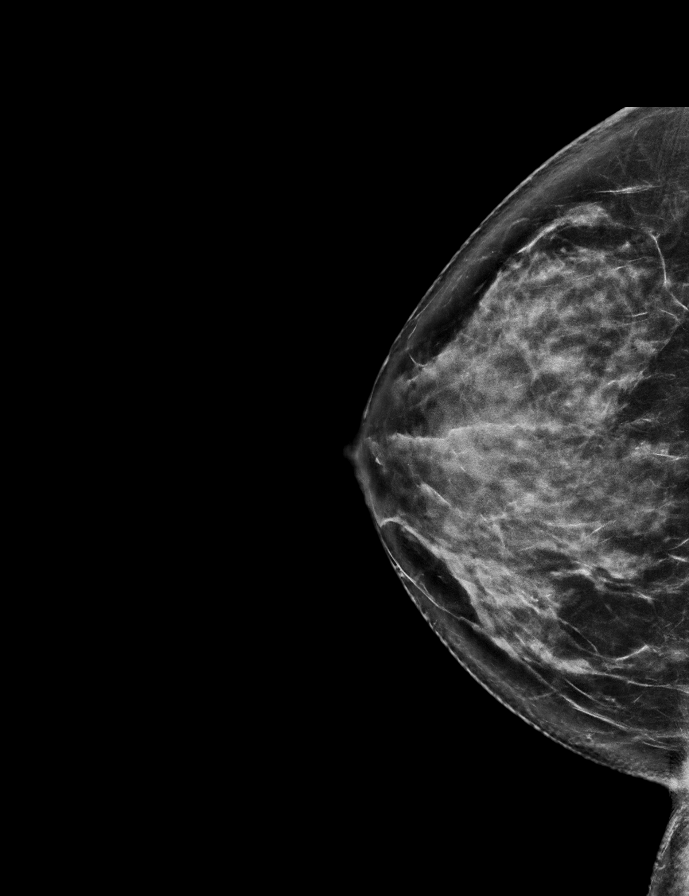

[L CC synth-2D]
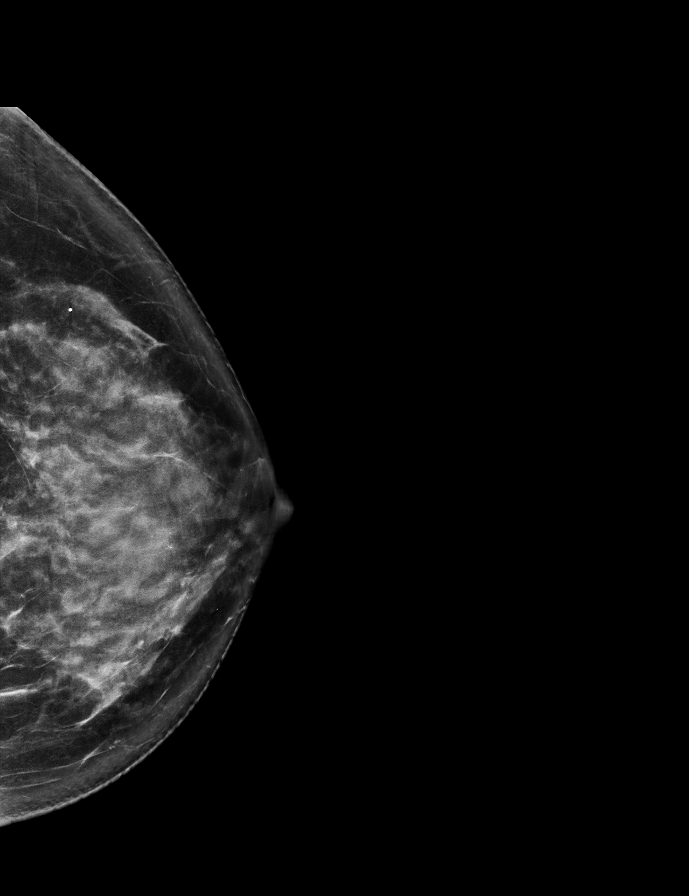

[L MLO synth-2D]
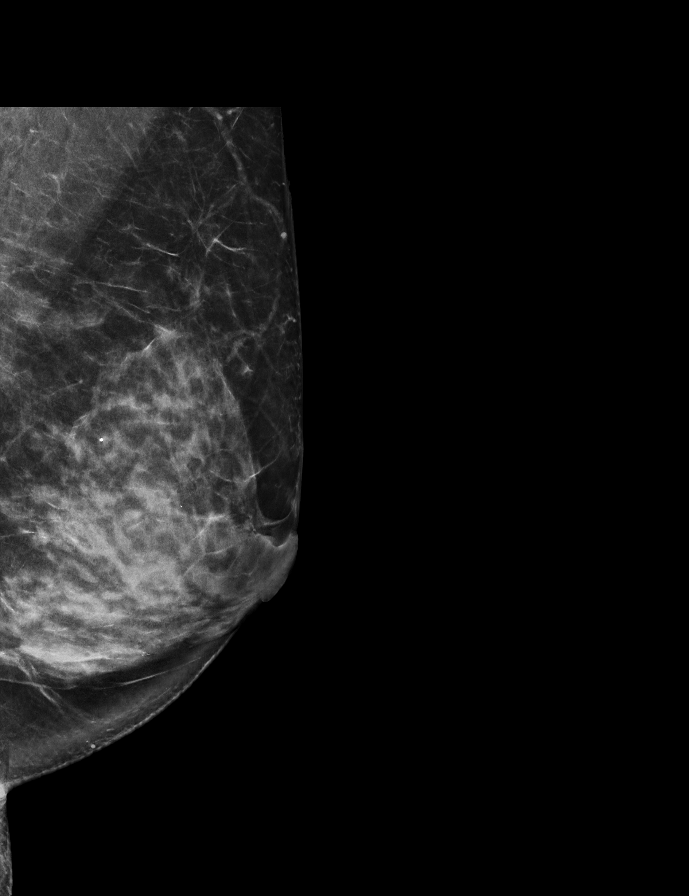

[L MLO tomo · 2 of 75 frames shown]
[frame 25/75]
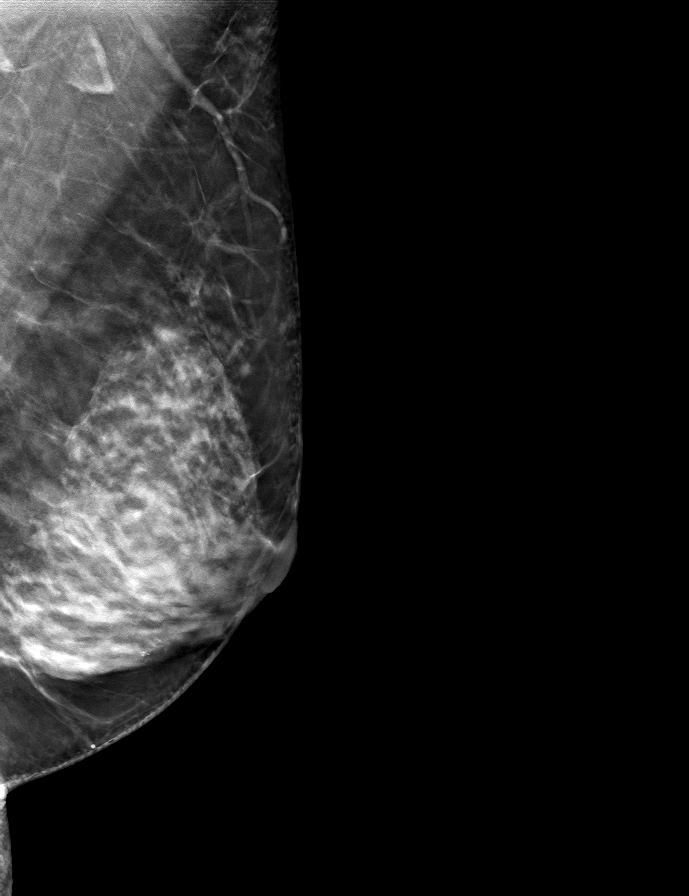
[frame 38/75]
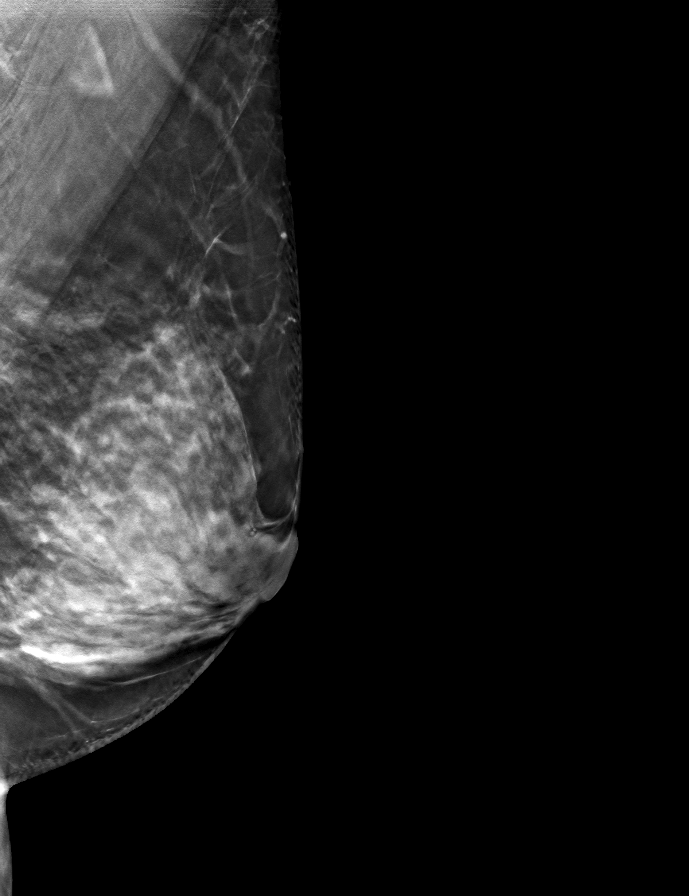

[R MLO tomo · tomo slice 39/76.0]
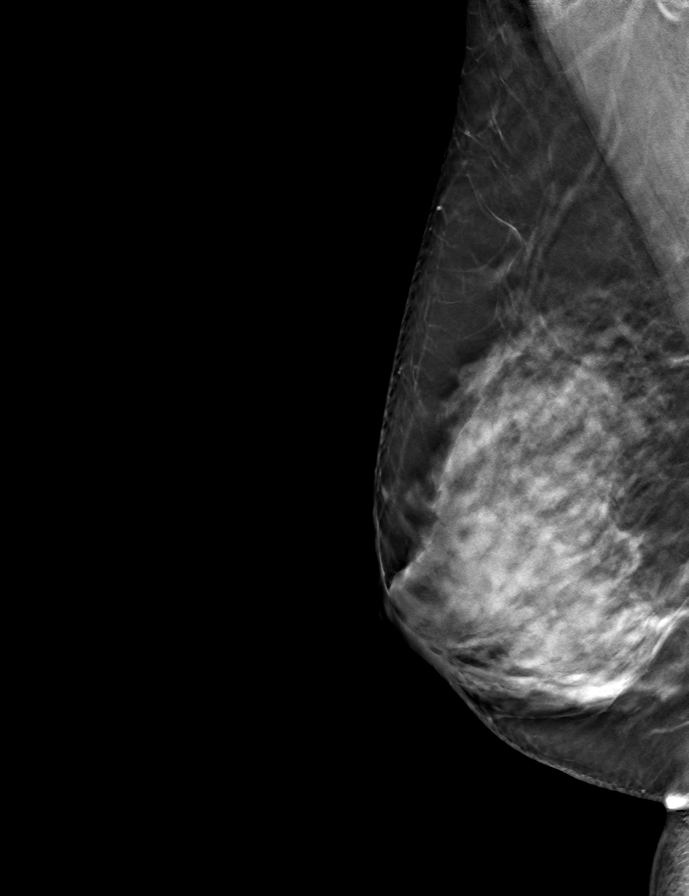

[L CC tomo · tomo slice 39/76.0]
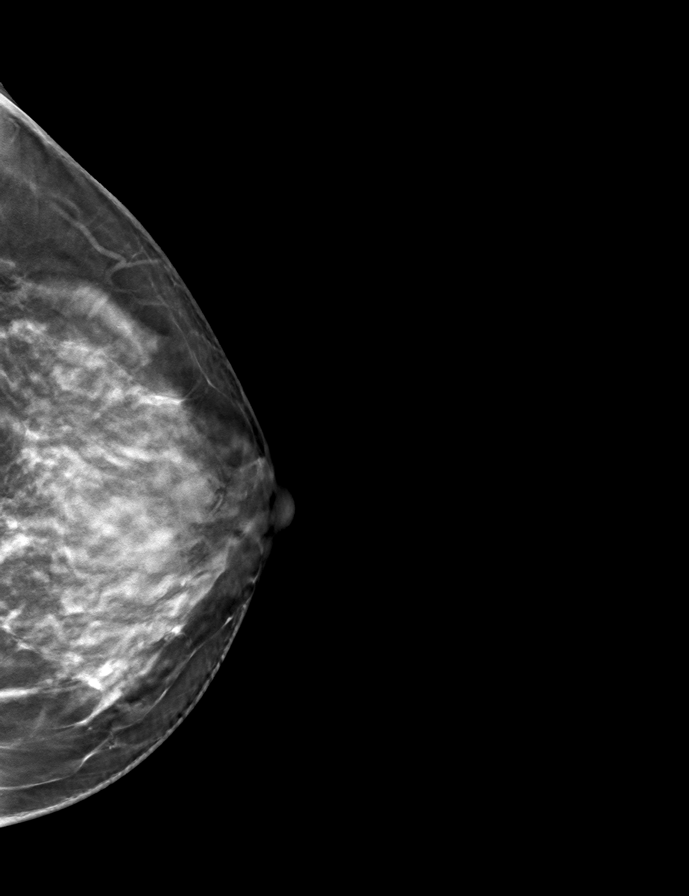

[R CC tomo · tomo slice 37/74.0]
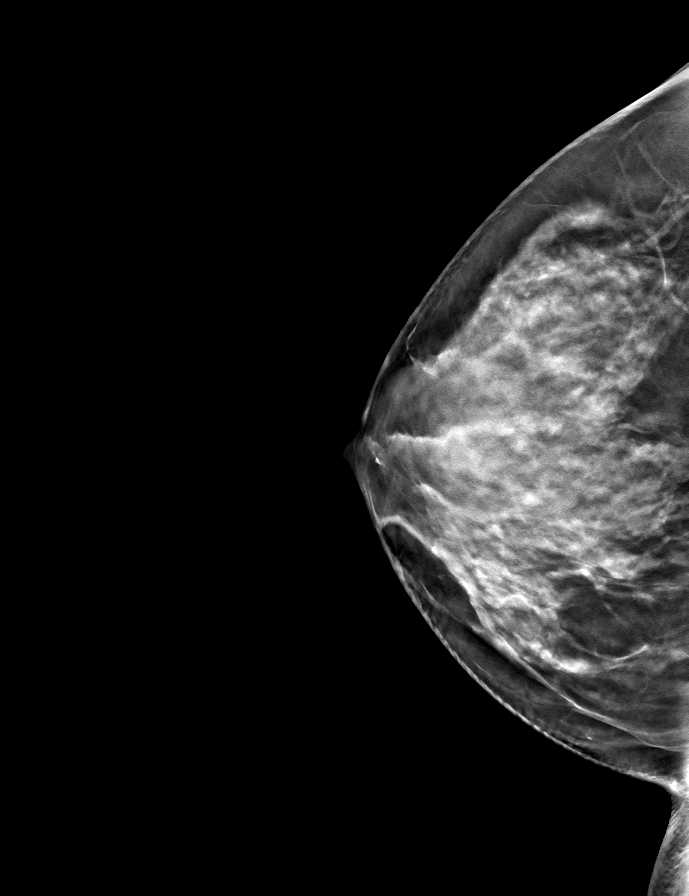

[9 of 24 positions shown; findings below may reference images not displayed]

ACR Breast Density Category d: The breast tissue is extremely dense,
which lowers the sensitivity of mammography
FINDINGS: There are no findings suspicious for malignancy.
IMPRESSION: No mammographic evidence of malignancy. A result letter of this
screening mammogram will be mailed directly to the patient.

RECOMMENDATION:
Screening mammogram in one year. (Code:[id number])

BI-RADS CATEGORY  1: Negative.

## 2022-11-15 ENCOUNTER — Encounter (HOSPITAL_COMMUNITY): Payer: Self-pay

## 2022-11-15 ENCOUNTER — Ambulatory Visit (HOSPITAL_COMMUNITY)
Admission: EM | Admit: 2022-11-15 | Discharge: 2022-11-15 | Disposition: A | Discharge status: Home / Self Care | Payer: Medicaid Other | Attending: Nurse Practitioner | Admitting: Nurse Practitioner

## 2022-11-15 DIAGNOSIS — K0889 Other specified disorders of teeth and supporting structures: Secondary | ICD-10-CM | POA: Diagnosis not present

## 2022-11-15 MED ORDER — IBUPROFEN 800 MG PO TABS: 800.0000 mg | ORAL_TABLET | Freq: Three times a day (TID) | ORAL | 0 refills | Status: AC | PRN

## 2022-11-15 MED ORDER — HYDROCODONE-ACETAMINOPHEN 5-325 MG PO TABS: 1.0000 | ORAL_TABLET | ORAL | 0 refills | Status: AC | PRN

## 2022-11-15 MED ORDER — AMOXICILLIN 875 MG PO TABS: 875.0000 mg | ORAL_TABLET | Freq: Two times a day (BID) | ORAL | 0 refills | Status: AC

## 2022-11-15 MED ORDER — CHLORHEXIDINE GLUCONATE 0.12 % MT SOLN: 15.0000 mL | Freq: Two times a day (BID) | OROMUCOSAL | 0 refills | Status: AC

## 2022-11-15 NOTE — Discharge Instructions (Signed)
Take medications as prescribed. Avoid very hot or very cold foods or drinks as well as sugary foods or drinks. Contact your dentist as soon as possible for follow-up

## 2022-11-15 NOTE — ED Triage Notes (Signed)
Pt  is here for dental pain with facial swelling x 3days

## 2022-11-15 NOTE — ED Provider Notes (Signed)
Elaine Hawkins    CSN: [id number] Arrival date & time: 11/15/22  1727      History   Chief Complaint Chief Complaint  Patient presents with   Dental Pain    HPI Elaine Hawkins is a 52 y.o. female.   History of Present Illness  Elaine Hawkins is a 52 y.o. female who presents with complaint of toothache. Her current symptoms have been ongoing now for the past 3 days. Patient describes pain as aching and throbbing. Pain severity now is 7 /10. The pain does not radiate. Patient has some facial swelling on the affected side but no fever. Pain is aggravated by movement, use, and palpation. Pain is alleviated by nothing. The patient denies other complaints. Patient reports that she was told over a year ago that she needed a root canal on the affected tooth but pain went away after taking a course of antibiotics and she never followed back up with her dentist.       Past Medical History:  Diagnosis Date   Allergy    COVID-19 virus infection    07-2019   Hyperlipidemia    was on Simvastatin- but off currently    Medical history non-contributory    Rash     Patient Active Problem List   Diagnosis Date Noted   Lab test positive for detection of COVID-19 virus 12/09/2019   Vulvar itching 01/16/2018    Past Surgical History:  Procedure Laterality Date   CESAREAN SECTION     x2-2001,2003   OOPHORECTOMY     L ovary   TUBAL LIGATION      OB History     Gravida  2   Para  2   Term  2   Preterm  0   AB  0   Living  2      SAB  0   IAB  0   Ectopic  0   Multiple  0   Live Births  2            Home Medications    Prior to Admission medications   Medication Sig Start Date End Date Taking? Authorizing Provider  amoxicillin (AMOXIL) 875 MG tablet Take 1 tablet (875 mg total) by mouth 2 (two) times daily for 7 days. 11/15/22 11/22/22 Yes Enrique Sack, FNP  Ascorbic Acid (VITAMIN C) 1000 MG tablet Take 1,000 mg by mouth daily.   Yes  [provider]  calcium-vitamin D (OSCAL WITH D) 250-125 MG-UNIT tablet Take 1 tablet by mouth daily.   Yes [provider]  chlorhexidine (PERIDEX) 0.12 % solution Use as directed 15 mLs in the mouth or throat 2 (two) times daily. Rinse with 15 mL after meals and at bedtime 11/15/22  Yes Kiosha Buchan, Aldona Bar, FNP  cholecalciferol (VITAMIN D3) 25 MCG (1000 UNIT) tablet Take 1,000 Units by mouth daily.   Yes [provider]  ezetimibe (ZETIA) 10 MG tablet Take 10 mg by mouth daily. 04/27/22  Yes [provider]  HYDROcodone-acetaminophen (NORCO/VICODIN) 5-325 MG tablet Take 1 tablet by mouth every 4 (four) hours as needed for severe pain. 11/15/22  Yes Enrique Sack, FNP  ibuprofen (ADVIL) 800 MG tablet Take 1 tablet (800 mg total) by mouth every 8 (eight) hours as needed for moderate pain or mild pain. 11/15/22  Yes Enrique Sack, FNP  Multiple Vitamins-Minerals (HAIR SKIN AND NAILS FORMULA PO) Take by mouth.   Yes [provider]  Multiple Vitamins-Minerals (MULTIVITAMIN WOMENS 50+ ADV  PO) Take by mouth.   Yes [provider]  DUPIXENT 300 MG/2ML SOPN Inject into the skin. 09/09/20   [provider]  EPINEPHrine (EPIPEN 2-PAK) 0.3 mg/0.3 mL IJ SOAJ injection Inject 0.3 mLs (0.3 mg total) into the muscle as needed for anaphylaxis. Patient not taking: Reported on 05/29/2020 09/17/19   Lorayne Bender, PA-C  meclizine (ANTIVERT) 25 MG tablet Take 1 tablet (25 mg total) by mouth 3 (three) times daily as needed for dizziness. 05/05/22   Scot Jun, NP    Family History Family History  Problem Relation Age of Onset   Hypercholesterolemia Mother    Liver cancer Father    Colon cancer Neg Hx    Colon polyps Neg Hx    Esophageal cancer Neg Hx    Rectal cancer Neg Hx    Stomach cancer Neg Hx     Social History Social History   Tobacco Use   Smoking status: Never   Smokeless tobacco: Never  Vaping Use   Vaping Use: Never used   Substance Use Topics   Alcohol use: Never   Drug use: Never     Allergies   Patient has no known allergies.   Review of Systems Review of Systems  Constitutional:  Negative for fever.  HENT:  Positive for dental problem. Negative for ear pain, sore throat and trouble swallowing.   Musculoskeletal:  Negative for neck pain and neck stiffness.     Physical Exam Triage Vital Signs ED Triage Vitals  Enc Vitals Group     BP 11/15/22 1823 115/73     Pulse Rate 11/15/22 1823 83     Resp 11/15/22 1823 12     Temp 11/15/22 1823 98.1 F (36.7 C)     Temp Source 11/15/22 1823 Oral     SpO2 11/15/22 1823 96 %     Weight --      Height --      Head Circumference --      Peak Flow --      Pain Score 11/15/22 1820 7     Pain Loc --      Pain Edu? --      Excl. in [place]? --    No data found.  Updated Vital Signs BP 115/73 (BP Location: Left Arm)   Pulse 83   Temp 98.1 F (36.7 C) (Oral)   Resp 12   LMP 10/17/2022   SpO2 96%   Visual Acuity Right Eye Distance:   Left Eye Distance:   Bilateral Distance:    Right Eye Near:   Left Eye Near:    Bilateral Near:     Physical Exam Constitutional:      General: She is not in acute distress.    Appearance: Normal appearance. She is not ill-appearing or toxic-appearing.  HENT:     Head: Normocephalic.     Nose: Nose normal.     Mouth/Throat:     Lips: Pink.     Mouth: Mucous membranes are moist.      Comments: Silver filing noted within affected tooth. No gum lesions, gingival swelling or dental abscess noted.  Eyes:     Conjunctiva/sclera: Conjunctivae normal.  Cardiovascular:     Rate and Rhythm: Normal rate.  Pulmonary:     Effort: Pulmonary effort is normal.  Musculoskeletal:        General: Normal range of motion.     Cervical back: Full passive range of motion without pain, normal range of  motion and neck supple.  Lymphadenopathy:     Cervical: No cervical adenopathy.  Skin:    General: Skin is warm and dry.   Neurological:     General: No focal deficit present.     Mental Status: She is alert and oriented to person, place, and time.      UC Treatments / Results  Labs (all labs ordered are listed, but only abnormal results are displayed) Labs Reviewed - No data to display  EKG   Radiology No results found.  Procedures Procedures (including critical care time)  Medications Ordered in UC Medications - No data to display  Initial Impression / Assessment and Plan / UC Course  I have reviewed the triage vital signs and the nursing notes.  Pertinent labs & imaging results that were available during my care of the patient were reviewed by me and considered in my medical decision making (see chart for details).    52 yo female presenting with dental pain. Patient has been told by dentistry in the past that she needs a root canal of the affected tooth. She is uncomfortable but afebrile and nontoxic. Silver filing noted within affected tooth. No gum lesions, gingival swelling or dental abscess noted. Amoxicillin, Peridex, Motrin and Norco prescribed.  Patient encouraged to follow-up with dentistry to have the root canal done.  Patient concerned about the cost of such procedure and was also given information to [facility] to see if they can provide a more affordable treatment for her.  Today's evaluation has revealed no signs of a dangerous process. Discussed diagnosis with patient and/or guardian. Patient and/or guardian aware of their diagnosis, possible red flag symptoms to watch out for and need for close follow up. Patient and/or guardian understands verbal and written discharge instructions. Patient and/or guardian comfortable with plan and disposition.  Patient and/or guardian has a clear mental status at this time, good insight into illness (after discussion and teaching) and has clear judgment to make decisions regarding their care  Documentation was completed with the aid of  voice recognition software. Transcription may contain typographical errors. Final Clinical Impressions(s) / UC Diagnoses   Final diagnoses:  Dentalgia     Discharge Instructions      Take medications as prescribed. Avoid very hot or very cold foods or drinks as well as sugary foods or drinks. Contact your dentist as soon as possible for follow-up       ED Prescriptions     Medication Sig Dispense Auth. Provider   amoxicillin (AMOXIL) 875 MG tablet Take 1 tablet (875 mg total) by mouth 2 (two) times daily for 7 days. 14 tablet Eleyna Brugh, [place], FNP   chlorhexidine (PERIDEX) 0.12 % solution Use as directed 15 mLs in the mouth or throat 2 (two) times daily. Rinse with 15 mL after meals and at bedtime 120 mL Enrique Sack, FNP   HYDROcodone-acetaminophen (NORCO/VICODIN) 5-325 MG tablet Take 1 tablet by mouth every 4 (four) hours as needed for severe pain. 10 tablet Enrique Sack, FNP   ibuprofen (ADVIL) 800 MG tablet Take 1 tablet (800 mg total) by mouth every 8 (eight) hours as needed for moderate pain or mild pain. 21 tablet Enrique Sack, FNP      I have reviewed the PDMP during this encounter.   Enrique Sack, [place] 11/15/22 1940

## 2022-12-27 ENCOUNTER — Other Ambulatory Visit: Payer: Self-pay | Admitting: Physician Assistant

## 2022-12-27 DIAGNOSIS — Z1231 Encounter for screening mammogram for malignant neoplasm of breast: Secondary | ICD-10-CM

## 2023-01-07 ENCOUNTER — Ambulatory Visit: Payer: Medicaid Other | Admitting: Physician Assistant

## 2023-01-11 ENCOUNTER — Ambulatory Visit
Admission: RE | Admit: 2023-01-11 | Discharge: 2023-01-11 | Disposition: A | Discharge status: Home / Self Care | Payer: Medicaid Other | Source: Ambulatory Visit | Attending: Physician Assistant | Admitting: Physician Assistant

## 2023-01-11 ENCOUNTER — Ambulatory Visit (INDEPENDENT_AMBULATORY_CARE_PROVIDER_SITE_OTHER): Payer: Medicaid Other | Admitting: Orthopaedic Surgery

## 2023-01-11 ENCOUNTER — Ambulatory Visit (INDEPENDENT_AMBULATORY_CARE_PROVIDER_SITE_OTHER): Payer: Medicaid Other

## 2023-01-11 ENCOUNTER — Encounter: Payer: Self-pay | Admitting: Physician Assistant

## 2023-01-11 DIAGNOSIS — Z1231 Encounter for screening mammogram for malignant neoplasm of breast: Secondary | ICD-10-CM

## 2023-01-11 DIAGNOSIS — M2241 Chondromalacia patellae, right knee: Secondary | ICD-10-CM

## 2023-01-11 MED ORDER — DICLOFENAC SODIUM 75 MG PO TBEC: 75.0000 mg | DELAYED_RELEASE_TABLET | Freq: Two times a day (BID) | ORAL | 2 refills | Status: AC | PRN

## 2023-01-11 NOTE — Progress Notes (Signed)
Office Visit Note   Patient: Elaine Hawkins           Date of Birth: 05-22-1971           MRN: [id number] Visit Date: 01/11/2023              Requested by: Quita Skye, PA-C [address] [place],  [place] [address] PCP: Laruth Bouchard, MD (Inactive)   Assessment & Plan: Visit Diagnoses:  1. Chondromalacia, patella, right     Plan: Impression is right knee chondromalacia patella.  Today, we discussed various treatment options to include topical/oral NSAIDs versus cortisone injection in addition to quad strengthening exercises.  She would like to first try topical and oral NSAIDs before proceeding with cortisone injection.  I have also provided her with a knee exercise program specifically to target the quadriceps.  She will follow-up with [place] as needed.  Follow-Up Instructions: Return if symptoms worsen or fail to improve.   Orders:  Orders Placed This Encounter  Procedures   XR KNEE 3 VIEW RIGHT   Meds ordered this encounter  Medications   diclofenac (VOLTAREN) 75 MG EC tablet    Sig: Take 1 tablet (75 mg total) by mouth 2 (two) times daily as needed.    Dispense:  60 tablet    Refill:  2      Procedures: No procedures performed   Clinical Data: No additional findings.   Subjective: Chief Complaint  Patient presents with   Right Knee - Pain    HPI patient is a pleasant 52 year old female who comes in today with right knee pain for the past year.  She denies any injury or change in activity.  The pain she has is primarily retro and infrapatellar.  This is described as a sharp pain.  No locking.  Symptoms appear to worsen with stair climbing.  She does not take medication for this.  Review of Systems as detailed in HPI.  All others reviewed and are negative.   Objective: Vital Signs: There were no vitals taken for this visit.  Physical Exam well-developed well-nourished female no acute distress.  Alert and oriented x 3.  Ortho Exam right knee exam shows  no effusion.  Range of motion 0 to 130 degrees.  Mild lateral joint line tenderness.  No tenderness to the patella.  No patellar apprehension.  Moderate patellofemoral crepitus.  Ligaments are stable.  She is neurovascular intact distally.  Specialty Comments:  No specialty comments available.  Imaging: XR KNEE 3 VIEW RIGHT  Result Date: 01/11/2023 X-rays demonstrate mild patellofemoral degenerative changes    PMFS History: Patient Active Problem List   Diagnosis Date Noted   Lab test positive for detection of COVID-19 virus 12/09/2019   Vulvar itching 01/16/2018   Past Medical History:  Diagnosis Date   Allergy    COVID-19 virus infection    07-2019   Hyperlipidemia    was on Simvastatin- but off currently    Medical history non-contributory    Rash     Family History  Problem Relation Age of Onset   Hypercholesterolemia Mother    Liver cancer Father    Colon cancer Neg Hx    Colon polyps Neg Hx    Esophageal cancer Neg Hx    Rectal cancer Neg Hx    Stomach cancer Neg Hx    Breast cancer Neg Hx     Past Surgical History:  Procedure Laterality Date   CESAREAN SECTION     [phone]  OOPHORECTOMY     L ovary   TUBAL LIGATION     Social History   Occupational History   Not on file  Tobacco Use   Smoking status: Never   Smokeless tobacco: Never  Vaping Use   Vaping Use: Never used  Substance and Sexual Activity   Alcohol use: Never   Drug use: Never   Sexual activity: Yes    Birth control/protection: Surgical

## 2024-02-14 ENCOUNTER — Other Ambulatory Visit (HOSPITAL_BASED_OUTPATIENT_CLINIC_OR_DEPARTMENT_OTHER): Payer: Self-pay | Admitting: Physician Assistant

## 2024-02-14 DIAGNOSIS — Z1231 Encounter for screening mammogram for malignant neoplasm of breast: Secondary | ICD-10-CM

## 2024-02-21 ENCOUNTER — Ambulatory Visit (HOSPITAL_BASED_OUTPATIENT_CLINIC_OR_DEPARTMENT_OTHER): Admitting: Radiology

## 2024-03-06 ENCOUNTER — Encounter (HOSPITAL_BASED_OUTPATIENT_CLINIC_OR_DEPARTMENT_OTHER): Payer: Self-pay

## 2024-03-06 ENCOUNTER — Ambulatory Visit (HOSPITAL_BASED_OUTPATIENT_CLINIC_OR_DEPARTMENT_OTHER)
Admission: RE | Admit: 2024-03-06 | Discharge: 2024-03-06 | Disposition: A | Discharge status: Home / Self Care | Source: Ambulatory Visit | Attending: Physician Assistant | Admitting: Physician Assistant

## 2024-03-06 DIAGNOSIS — Z1231 Encounter for screening mammogram for malignant neoplasm of breast: Secondary | ICD-10-CM | POA: Diagnosis present

## 2024-05-13 ENCOUNTER — Encounter (HOSPITAL_COMMUNITY): Payer: Self-pay | Admitting: Emergency Medicine

## 2024-05-13 ENCOUNTER — Ambulatory Visit (HOSPITAL_COMMUNITY)
Admission: EM | Admit: 2024-05-13 | Discharge: 2024-05-13 | Disposition: A | Discharge status: Home / Self Care | Attending: Emergency Medicine | Admitting: Emergency Medicine

## 2024-05-13 DIAGNOSIS — R55 Syncope and collapse: Secondary | ICD-10-CM | POA: Insufficient documentation

## 2024-05-13 DIAGNOSIS — E860 Dehydration: Secondary | ICD-10-CM | POA: Insufficient documentation

## 2024-05-13 LAB — CBC
HCT: 43.4 % (ref 36.0–46.0)
Hemoglobin: 15 g/dL (ref 12.0–15.0)
MCH: 32.5 pg (ref 26.0–34.0)
MCHC: 34.6 g/dL (ref 30.0–36.0)
MCV: 93.9 fL (ref 80.0–100.0)
Platelets: 241 K/uL (ref 150–400)
RBC: 4.62 MIL/uL (ref 3.87–5.11)
RDW: 11.8 % (ref 11.5–15.5)
WBC: 3.3 K/uL — ABNORMAL LOW (ref 4.0–10.5)
nRBC: 0 % (ref 0.0–0.2)

## 2024-05-13 LAB — BASIC METABOLIC PANEL WITH GFR
Anion gap: 10 (ref 5–15)
BUN: 11 mg/dL (ref 6–20)
CO2: 24 mmol/L (ref 22–32)
Calcium: 9.4 mg/dL (ref 8.9–10.3)
Chloride: 105 mmol/L (ref 98–111)
Creatinine, Ser: 0.58 mg/dL (ref 0.44–1.00)
GFR, Estimated: >60 mL/min (ref >60)
Glucose, Bld: 127 mg/dL — ABNORMAL HIGH (ref 70–99)
Potassium: 3.3 mmol/L — ABNORMAL LOW (ref 3.5–5.1)
Sodium: 139 mmol/L (ref 135–145)

## 2024-05-13 LAB — POCT FASTING CBG KUC MANUAL ENTRY: POCT Glucose (KUC): 155 mg/dL — AB (ref 70–99)

## 2024-05-13 NOTE — Discharge Instructions (Signed)
 Your workup today is overall reassuring.  We have drawn to basic labs to evaluate your blood counts and electrolytes to rule underlying causes for your episode of loss of consciousness yesterday. If you have another episode of loss of consciousness, become very weak or dizzy, develop chest pain or shortness of breath please seek immediate medical treatment in the emergency department.

## 2024-05-13 NOTE — ED Triage Notes (Signed)
 Pt reports that yesterday was with friends and started seeing spots and then past out. Reports was with EMT and traveling nurse, so they checked her vital signs and was given an IV fluids. Reports they were concerned with her BP was low and her pulse was low and recommended her being seen. Denies feeling fatigued weak etc today I am feeling much better today than when this happened.  Currently on amoxicillin  for root canal that had done.  Pt adds Yesterday morning woke up with a headache before the incident happened.

## 2024-05-13 NOTE — ED Provider Notes (Signed)
 [facility]    CSN: [id number] Arrival date & time: 05/13/24  1026      History   Chief Complaint Chief Complaint  Patient presents with   Loss of Consciousness    HPI Elaine Hawkins is a 53 y.o. female.   Patient presents with concerns for syncopal episode that occurred yesterday.  Patient states that she was hanging at the pool with with some friends yesterday when she began seeing spots and then used had a brief syncopal episode.  Patient reports that friends that she was with were an EMT and traveling nurse and they checked her vital signs and gave her IV fluids and she reports that she improved with this.  Patient states that she did have a mild headache prior to episode of loss of consciousness that occurred yesterday.  Patient states that she had not eaten or drank anything prior to the syncopal episode that occurred yesterday.  Patient states that she believes she may have been dehydrated especially because she had much improvement after IV fluids.  Patient denies any chest pain or dizziness during the incident.  Patient was concerned because she was told that her blood pressure and pulse were initially low following syncopal episode and wanted to be evaluated for this.  Patient states that she is feeling much better today and has no complaints at this time.  Patient has a history of hyperlipidemia and takes ezetimibe for this daily.  Patient also reports that she does Dupixent injections for chronic skin condition, but has not done 1 of these recently and has been doing this for years.  Patient denies any other medications or change in medications recently.  Patient is perimenopausal and also has history of tubal ligation.  The history is provided by the patient and medical records.  Loss of Consciousness   Past Medical History:  Diagnosis Date   Allergy    COVID-19 virus infection    07-2019   Hyperlipidemia    was on Simvastatin- but off currently     Medical history non-contributory    Rash     Patient Active Problem List   Diagnosis Date Noted   Lab test positive for detection of COVID-19 virus 12/09/2019   Vulvar itching 01/16/2018    Past Surgical History:  Procedure Laterality Date   CESAREAN SECTION     x2-2001,2003   OOPHORECTOMY     L ovary   TUBAL LIGATION      OB History     Gravida  2   Para  2   Term  2   Preterm  0   AB  0   Living  2      SAB  0   IAB  0   Ectopic  0   Multiple  0   Live Births  2            Home Medications    Prior to Admission medications   Medication Sig Start Date End Date Taking? Authorizing Provider  Ascorbic Acid (VITAMIN C) 1000 MG tablet Take 1,000 mg by mouth daily.    [provider]  calcium-vitamin D (OSCAL WITH D) 250-125 MG-UNIT tablet Take 1 tablet by mouth daily.    [provider]  chlorhexidine  (PERIDEX ) 0.12 % solution Use as directed 15 mLs in the mouth or throat 2 (two) times daily. Rinse with 15 mL after meals and at bedtime 11/15/22   Iola Lukes, FNP  cholecalciferol (VITAMIN D3) 25 MCG (  1000 UNIT) tablet Take 1,000 Units by mouth daily.    [provider]  diclofenac  (VOLTAREN ) 75 MG EC tablet Take 1 tablet (75 mg total) by mouth 2 (two) times daily as needed. 01/11/23   Jule Ronal CROME, PA-C  DUPIXENT 300 MG/2ML SOPN Inject into the skin. 09/09/20   [provider]  EPINEPHrine  (EPIPEN  2-PAK) 0.3 mg/0.3 mL IJ SOAJ injection Inject 0.3 mLs (0.3 mg total) into the muscle as needed for anaphylaxis. 09/17/19   Joy, Shawn C, PA-C  ezetimibe (ZETIA) 10 MG tablet Take 10 mg by mouth daily. 04/27/22   [provider]  HYDROcodone -acetaminophen  (NORCO/VICODIN) 5-325 MG tablet Take 1 tablet by mouth every 4 (four) hours as needed for severe pain. 11/15/22   Iola Lukes, FNP  ibuprofen  (ADVIL ) 800 MG tablet Take 1 tablet (800 mg total) by mouth every 8 (eight) hours as needed for moderate pain or mild  pain. 11/15/22   Murrill, Samantha, FNP  meclizine  (ANTIVERT ) 25 MG tablet Take 1 tablet (25 mg total) by mouth 3 (three) times daily as needed for dizziness. 05/05/22   Arloa Suzen RAMAN, NP  Multiple Vitamins-Minerals (HAIR SKIN AND NAILS FORMULA PO) Take by mouth.    [provider]  Multiple Vitamins-Minerals (MULTIVITAMIN WOMENS 50+ ADV PO) Take by mouth.    [provider]    Family History Family History  Problem Relation Age of Onset   Hypercholesterolemia Mother    Liver cancer Father    Colon cancer Neg Hx    Colon polyps Neg Hx    Esophageal cancer Neg Hx    Rectal cancer Neg Hx    Stomach cancer Neg Hx    Breast cancer Neg Hx     Social History Social History   Tobacco Use   Smoking status: Never   Smokeless tobacco: Never  Vaping Use   Vaping status: Never Used  Substance Use Topics   Alcohol use: Never   Drug use: Never     Allergies   Patient has no known allergies.   Review of Systems Review of Systems  Cardiovascular:  Positive for syncope.   Per HPI  Physical Exam Triage Vital Signs ED Triage Vitals  Encounter Vitals Group     BP 05/13/24 1049 103/66     Girls Systolic BP Percentile --      Girls Diastolic BP Percentile --      Boys Systolic BP Percentile --      Boys Diastolic BP Percentile --      Pulse Rate 05/13/24 1049 90     Resp 05/13/24 1049 17     Temp 05/13/24 1049 97.7 F (36.5 C)     Temp Source 05/13/24 1049 Oral     SpO2 05/13/24 1049 97 %     Weight --      Height --      Head Circumference --      Peak Flow --      Pain Score 05/13/24 1047 0     Pain Loc --      Pain Education --      Exclude from Growth Chart --    Orthostatic VS for the past 24 hrs:  BP- Lying Pulse- Lying BP- Sitting Pulse- Sitting BP- Standing at 0 minutes Pulse- Standing at 0 minutes  05/13/24 1106 112/74 79 119/75 85 110/69 87    Updated Vital Signs BP 103/66 (BP Location: Left Arm)   Pulse 90   Temp 97.7 F (36.5  C)  (Oral)   Resp 17   SpO2 97%   Visual Acuity Right Eye Distance:   Left Eye Distance:   Bilateral Distance:    Right Eye Near:   Left Eye Near:    Bilateral Near:     Physical Exam Vitals and nursing note reviewed.  Constitutional:      General: She is awake. She is not in acute distress.    Appearance: Normal appearance. She is well-developed and well-groomed. She is not ill-appearing.  HENT:     Head: Normocephalic.     Right Ear: Tympanic membrane, ear canal and external ear normal.     Left Ear: Tympanic membrane, ear canal and external ear normal.     Nose: Nose normal.     Mouth/Throat:     Mouth: Mucous membranes are moist.     Pharynx: Oropharynx is clear.  Eyes:     Extraocular Movements: Extraocular movements intact.     Pupils: Pupils are equal, round, and reactive to light.  Cardiovascular:     Rate and Rhythm: Normal rate and regular rhythm.  Pulmonary:     Effort: Pulmonary effort is normal.     Breath sounds: Normal breath sounds.  Musculoskeletal:        General: Normal range of motion.     Cervical back: Normal range of motion and neck supple.  Skin:    General: Skin is warm and dry.  Neurological:     General: No focal deficit present.     Mental Status: She is alert and oriented to person, place, and time. Mental status is at baseline.     GCS: GCS eye subscore is 4. GCS verbal subscore is 5. GCS motor subscore is 6.     Cranial Nerves: Cranial nerves 2-12 are intact.     Sensory: Sensation is intact.     Motor: Motor function is intact.     Coordination: Coordination is intact.     Gait: Gait is intact.  Psychiatric:        Behavior: Behavior is cooperative.      UC Treatments / Results  Labs (all labs ordered are listed, but only abnormal results are displayed) Labs Reviewed  POCT FASTING CBG [facility] MANUAL ENTRY - Abnormal; Notable for the following components:      Result Value   POCT Glucose ([facility]) 155 (*)    All other components within  normal limits  CBC  BASIC METABOLIC PANEL WITH GFR    EKG   Radiology No results found.  Procedures Procedures (including critical care time)  Medications Ordered in UC Medications - No data to display  Initial Impression / Assessment and Plan / UC Course  I have reviewed the triage vital signs and the nursing notes.  Pertinent labs & imaging results that were available during my care of the patient were reviewed by me and considered in my medical decision making (see chart for details).     Patient is overall well-appearing.  Vitals are stable.  No significant findings on exam.  No neurodeficits noted.  GCS 15.  EKG revealed normal sinus rhythm without ST elevation, depression, or acute cardiac findings.  Nonfasting CBG within normal limits.  Orthostatic vital signs unremarkable.  Ordered CBC and BMP to rule out underlying causes related to syncopal episode.  Syncopal episode likely result of dehydration due to patient not eating or drinking and then spending time outside.  Exam today is overall reassuring.  Discussed follow-up, return, and  strict ER precautions. Final Clinical Impressions(s) / UC Diagnoses   Final diagnoses:  Syncope, unspecified syncope type  Dehydration     Discharge Instructions      Your workup today is overall reassuring.  We have drawn to basic labs to evaluate your blood counts and electrolytes to rule underlying causes for your episode of loss of consciousness yesterday. If you have another episode of loss of consciousness, become very weak or dizzy, develop chest pain or shortness of breath please seek immediate medical treatment in the emergency department.   ED Prescriptions   None    PDMP not reviewed this encounter.   Johnie Flaming A, NP 05/13/24 [phone]

## 2024-05-15 ENCOUNTER — Ambulatory Visit (HOSPITAL_COMMUNITY): Payer: Self-pay

## 2024-06-09 ENCOUNTER — Encounter (HOSPITAL_COMMUNITY): Payer: Self-pay | Admitting: *Deleted

## 2024-06-09 ENCOUNTER — Other Ambulatory Visit: Payer: Self-pay

## 2024-06-09 ENCOUNTER — Ambulatory Visit (HOSPITAL_COMMUNITY)
Admission: EM | Admit: 2024-06-09 | Discharge: 2024-06-09 | Disposition: A | Discharge status: Home / Self Care | Attending: Emergency Medicine | Admitting: Emergency Medicine

## 2024-06-09 DIAGNOSIS — R42 Dizziness and giddiness: Secondary | ICD-10-CM

## 2024-06-09 DIAGNOSIS — H6591 Unspecified nonsuppurative otitis media, right ear: Secondary | ICD-10-CM | POA: Diagnosis not present

## 2024-06-09 LAB — POCT FASTING CBG KUC MANUAL ENTRY: POCT Glucose (KUC): 123 mg/dL — AB (ref 70–99)

## 2024-06-09 NOTE — Discharge Instructions (Addendum)
 You were seen in the urgent care today for concerns of lightheadedness. Your EKG and glucose level were normal and I do not think this is likely what is causing your symptoms. You have a small amount of fluid behind the ear drum on the right side which may be causing some of this lightheadedness. I would suggest using your prescription of meclizine  as prescribed as well as a steroid spray like Flonase to help address this fluid build up. For any concerns of worsening symptoms, please go tot he emergency department. Otherwise, follow up with your primary care provider.

## 2024-06-09 NOTE — ED Triage Notes (Signed)
 PT reports thinks are spinning but she is light headed not dizzy. Same type of feeling occurred on 8-31 .

## 2024-06-09 NOTE — ED Provider Notes (Signed)
 [facility]    CSN: [id number] Arrival date & time: 06/09/24  1239      History   Chief Complaint Chief Complaint  Patient presents with   Dizziness    HPI Elaine Hawkins is a 53 y.o. female.  Patient without significant medical history presents to urgent care today for concerns of lightheadedness.  She reports a slight feeling of the room spinning sensation but does not endorse actual dizziness.  Reports she had similar symptoms on 05/14/2023 in which she was seen at urgent care following a reported syncopal episode. She states this feeling is distinct from prior episodes of vertigo she has experienced as she does not feel nauseous with this.  Dizziness   Past Medical History:  Diagnosis Date   Allergy    COVID-19 virus infection    07-2019   Hyperlipidemia    was on Simvastatin- but off currently    Medical history non-contributory    Rash     Patient Active Problem List   Diagnosis Date Noted   Lab test positive for detection of COVID-19 virus 12/09/2019   Vulvar itching 01/16/2018    Past Surgical History:  Procedure Laterality Date   CESAREAN SECTION     x2-2001,2003   OOPHORECTOMY     L ovary   TUBAL LIGATION      OB History     Gravida  2   Para  2   Term  2   Preterm  0   AB  0   Living  2      SAB  0   IAB  0   Ectopic  0   Multiple  0   Live Births  2            Home Medications    Prior to Admission medications   Medication Sig Start Date End Date Taking? Authorizing Provider  Ascorbic Acid (VITAMIN C) 1000 MG tablet Take 1,000 mg by mouth daily.   Yes [provider]  calcium-vitamin D (OSCAL WITH D) 250-125 MG-UNIT tablet Take 1 tablet by mouth daily.   Yes [provider]  cholecalciferol (VITAMIN D3) 25 MCG (1000 UNIT) tablet Take 1,000 Units by mouth daily.   Yes [provider]  ezetimibe (ZETIA) 10 MG tablet Take 10 mg by mouth daily. 04/27/22  Yes [provider]   Multiple Vitamins-Minerals (HAIR SKIN AND NAILS FORMULA PO) Take by mouth.   Yes [provider]  Multiple Vitamins-Minerals (MULTIVITAMIN WOMENS 50+ ADV PO) Take by mouth.   Yes [provider]  chlorhexidine  (PERIDEX ) 0.12 % solution Use as directed 15 mLs in the mouth or throat 2 (two) times daily. Rinse with 15 mL after meals and at bedtime 11/15/22   Iola Lukes, FNP  diclofenac  (VOLTAREN ) 75 MG EC tablet Take 1 tablet (75 mg total) by mouth 2 (two) times daily as needed. 01/11/23   Jule Ronal CROME, PA-C  DUPIXENT 300 MG/2ML SOPN Inject into the skin. 09/09/20   [provider]  EPINEPHrine  (EPIPEN  2-PAK) 0.3 mg/0.3 mL IJ SOAJ injection Inject 0.3 mLs (0.3 mg total) into the muscle as needed for anaphylaxis. 09/17/19   Joy, Shawn C, PA-C  HYDROcodone -acetaminophen  (NORCO/VICODIN) 5-325 MG tablet Take 1 tablet by mouth every 4 (four) hours as needed for severe pain. 11/15/22   Murrill, Samantha, FNP  ibuprofen  (ADVIL ) 800 MG tablet Take 1 tablet (800 mg total) by mouth every 8 (eight) hours as needed for moderate pain or  mild pain. 11/15/22   Murrill, Samantha, FNP  meclizine  (ANTIVERT ) 25 MG tablet Take 1 tablet (25 mg total) by mouth 3 (three) times daily as needed for dizziness. 05/05/22   Arloa Suzen RAMAN, NP    Family History Family History  Problem Relation Age of Onset   Hypercholesterolemia Mother    Liver cancer Father    Colon cancer Neg Hx    Colon polyps Neg Hx    Esophageal cancer Neg Hx    Rectal cancer Neg Hx    Stomach cancer Neg Hx    Breast cancer Neg Hx     Social History Social History   Tobacco Use   Smoking status: Never   Smokeless tobacco: Never  Vaping Use   Vaping status: Never Used  Substance Use Topics   Alcohol use: Never   Drug use: Never     Allergies   Patient has no known allergies.   Review of Systems Review of Systems  Neurological:  Positive for light-headedness. Negative for dizziness.  All other systems  reviewed and are negative.    Physical Exam Triage Vital Signs ED Triage Vitals [06/09/24 1255]  Encounter Vitals Group     BP 110/75     Girls Systolic BP Percentile      Girls Diastolic BP Percentile      Boys Systolic BP Percentile      Boys Diastolic BP Percentile      Pulse Rate 88     Resp 18     Temp 97.7 F (36.5 C)     Temp src      SpO2 94 %     Weight      Height      Head Circumference      Peak Flow      Pain Score 0     Pain Loc      Pain Education      Exclude from Growth Chart    No data found.  Updated Vital Signs BP 110/75   Pulse 88   Temp 97.7 F (36.5 C)   Resp 18   SpO2 94%   Visual Acuity Right Eye Distance:   Left Eye Distance:   Bilateral Distance:    Right Eye Near:   Left Eye Near:    Bilateral Near:     Physical Exam Vitals and nursing note reviewed.  Constitutional:      General: She is not in acute distress.    Appearance: She is well-developed.  HENT:     Head: Normocephalic and atraumatic.     Right Ear: Hearing, ear canal and external ear normal. A middle ear effusion is present.     Left Ear: Hearing, tympanic membrane, ear canal and external ear normal.  No middle ear effusion.     Ears:     Comments: Small right sided middle ear effusion. Eyes:     General: No visual field deficit.    Conjunctiva/sclera: Conjunctivae normal.  Cardiovascular:     Rate and Rhythm: Normal rate and regular rhythm.     Heart sounds: No murmur heard. Pulmonary:     Effort: Pulmonary effort is normal. No respiratory distress.     Breath sounds: Normal breath sounds.  Abdominal:     Palpations: Abdomen is soft.     Tenderness: There is no abdominal tenderness.  Musculoskeletal:        General: No swelling.     Cervical back: Neck supple.  Skin:  General: Skin is warm and dry.     Capillary Refill: Capillary refill takes less than 2 seconds.  Neurological:     General: No focal deficit present.     Mental Status: She is alert  and oriented to person, place, and time. Mental status is at baseline.     GCS: GCS eye subscore is 4. GCS verbal subscore is 5. GCS motor subscore is 6.     Cranial Nerves: Cranial nerves 2-12 are intact. No cranial nerve deficit, dysarthria or facial asymmetry.     Sensory: Sensation is intact. No sensory deficit.     Motor: No weakness.     Coordination: Coordination is intact.  Psychiatric:        Mood and Affect: Mood normal.      UC Treatments / Results  Labs (all labs ordered are listed, but only abnormal results are displayed) Labs Reviewed  POCT FASTING CBG [facility] MANUAL ENTRY - Abnormal; Notable for the following components:      Result Value   POCT Glucose ([facility]) 123 (*)    All other components within normal limits    EKG   Radiology No results found.  Procedures ED EKG  Date/Time: 06/09/2024 1:02 PM  Performed by: Cecily Legrand LABOR, PA-C Authorized by: Eren Ryser A, PA-C   Previous ECG:    Previous ECG:  Compared to current   Similarity:  No change Interpretation:    Interpretation: normal   Rate:    ECG rate:  88   ECG rate assessment: normal   Rhythm:    Rhythm: sinus rhythm   Ectopy:    Ectopy: none   QRS:    QRS axis:  Normal   QRS intervals:  Normal   QRS conduction: normal   ST segments:    ST segments:  Normal T waves:    T waves: normal    (including critical care time)  Medications Ordered in UC Medications - No data to display  Initial Impression / Assessment and Plan / UC Course  I have reviewed the triage vital signs and the nursing notes.  Pertinent labs & imaging results that were available during my care of the patient were reviewed by me and considered in my medical decision making (see chart for details).     This patient presents to the UC for concern of lightheadedness. Differential diagnosis includes vertigo, lightheadedness, hypoglycemia, arrhythmia, dehydration   Lab Tests:  I Ordered, and personally interpreted  labs.  The pertinent results include:  Non-fasting CBG unremarkable at 123   Problem List / UC Course:  Patient with past history significant for seasonal allergies presents to urgent care today with concerns of dizziness/lightheadedness.  Reports symptoms ongoing for the last day or so with what she describes as a head fullness type feeling but no reported range being sensation.  Had similar symptoms about 1 month ago but at time and had a syncopal episode.  Denies any syncope.  No vomiting, diarrhea, fever, chills, body aches, cough, congestion, or sore throat. Physical exam is unremarkable.  No acute findings to suggest cranial nerve deficits.  EOM intact.  Tympanic membrane on the right appears to have a effusion present which are contributing to some of her symptoms of lightheadedness.  No appreciable nystagmus. CBG normal at 123 for nonfasting level.  EKG at baseline with no acute findings seen to suggest any sort of ischemia, arrhythmia, or structural heart disease. Based on workup, advised patient.  Likely combination of meclizine   for feelings of dizziness as well as nasal steroid spray such as Flonase to address the left slight effusion to the right ear.  Return precautions advised.  ED precautions discussed.  Patient stable for outpatient follow-up and discharged home.   Social Determinants of Health:  None  Final Clinical Impressions(s) / UC Diagnoses   Final diagnoses:  Lightheadedness  Fluid level behind tympanic membrane of right ear     Discharge Instructions      You were seen in the ER today for concerns of lightheadedness. Your EKG and glucose level were normal and I do not think this is likely what is causing your symptoms. You have a small amount of fluid behind the ear drum on the right side which may be causing some of this lightheadedness. I would suggest using your prescription of meclizine  as prescribed as well as a steroid spray like Flonase to help address this  fluid build up. For any concerns of worsening symptoms, please go tot he emergency department. Otherwise, follow up with your primary care provider.     ED Prescriptions   None    PDMP not reviewed this encounter.   Jermayne Sweeney A, PA-C 06/09/24 1352

## 2024-08-23 LAB — HM PAP SMEAR

## 2024-08-23 LAB — RESULTS CONSOLE HPV: CHL HPV: POSITIVE

## 2024-09-05 ENCOUNTER — Ambulatory Visit (HOSPITAL_COMMUNITY)
Admission: EM | Admit: 2024-09-05 | Discharge: 2024-09-05 | Disposition: A | Discharge status: Home / Self Care | Attending: Family Medicine | Admitting: Family Medicine

## 2024-09-05 ENCOUNTER — Encounter (HOSPITAL_COMMUNITY): Payer: Self-pay | Admitting: Emergency Medicine

## 2024-09-05 DIAGNOSIS — B9689 Other specified bacterial agents as the cause of diseases classified elsewhere: Secondary | ICD-10-CM | POA: Insufficient documentation

## 2024-09-05 DIAGNOSIS — N898 Other specified noninflammatory disorders of vagina: Secondary | ICD-10-CM | POA: Insufficient documentation

## 2024-09-05 DIAGNOSIS — N760 Acute vaginitis: Secondary | ICD-10-CM | POA: Diagnosis present

## 2024-09-05 MED ORDER — METRONIDAZOLE 500 MG PO TABS: 500.0000 mg | ORAL_TABLET | Freq: Two times a day (BID) | ORAL | 0 refills | Status: DC

## 2024-09-05 MED ORDER — METRONIDAZOLE 500 MG PO TABS: 500.0000 mg | ORAL_TABLET | Freq: Two times a day (BID) | ORAL | 0 refills | Status: AC

## 2024-09-05 NOTE — ED Triage Notes (Signed)
 Pt c/o vaginal discharge with foul odor   Onset today

## 2024-09-06 LAB — COLOGUARD: COLOGUARD: NEGATIVE

## 2024-09-07 ENCOUNTER — Ambulatory Visit (HOSPITAL_COMMUNITY): Payer: Self-pay

## 2024-09-07 LAB — CERVICOVAGINAL ANCILLARY ONLY
Bacterial Vaginitis (gardnerella): POSITIVE — AB
Candida Glabrata: NEGATIVE
Candida Vaginitis: NEGATIVE
Chlamydia: NEGATIVE
Comment: NEGATIVE
Comment: NEGATIVE
Comment: NEGATIVE
Comment: NEGATIVE
Comment: NEGATIVE
Comment: NORMAL
Neisseria Gonorrhea: NEGATIVE
Trichomonas: NEGATIVE

## 2024-09-08 NOTE — ED Provider Notes (Signed)
 " [facility]   [id number] 09/05/24 Arrival Time: 1449  ASSESSMENT & PLAN:  1. Vaginal discharge   2. BV (bacterial vaginosis)    Will tx for BV. Meds ordered this encounter  Medications   DISCONTD: metroNIDAZOLE  (FLAGYL ) 500 MG tablet    Sig: Take 1 tablet (500 mg total) by mouth 2 (two) times daily.    Dispense:  14 tablet    Refill:  0   metroNIDAZOLE  (FLAGYL ) 500 MG tablet    Sig: Take 1 tablet (500 mg total) by mouth 2 (two) times daily.    Dispense:  14 tablet    Refill:  0    Without s/s of PID.  Vaginal cytology pending.    Will notify of any positive results. Instructed to refrain from sexual activity for at least seven days.  Reviewed expectations re: course of current medical issues. Questions answered. Outlined signs and symptoms indicating need for more acute intervention. Patient verbalized understanding. After Visit Summary given.   SUBJECTIVE:  Elaine Hawkins is a 53 y.o. female who presents with complaint of vaginal discharge with foul odor. H/O BV.  Onset today    No LMP recorded (exact date). Patient is perimenopausal.   OBJECTIVE:  Vitals:   09/05/24 1601 09/05/24 1602  BP: 122/66   Pulse: 72   Resp: 16   Temp: 98.2 F (36.8 C)   TempSrc: Oral   SpO2: 96%   Weight:  53.5 kg  Height:  5' (1.524 m)     General appearance: alert, cooperative, appears stated age and no distress Lungs: unlabored respirations; speaks full sentences without difficulty Back: no CVA tenderness; FROM at waist Abdomen: soft, non-tender GU: deferred Skin: warm and dry Psychological: alert and cooperative; normal mood and affect.    Labs Reviewed  CERVICOVAGINAL ANCILLARY ONLY - Abnormal; Notable for the following components:      Result Value   Bacterial Vaginitis (gardnerella) Positive (*)    All other components within normal limits    Allergies[1]  Past Medical History:  Diagnosis Date   Allergy    COVID-19 virus infection     07-2019   Hyperlipidemia    was on Simvastatin- but off currently    Medical history non-contributory    Rash    Family History  Problem Relation Age of Onset   Hypercholesterolemia Mother    Liver cancer Father    Colon cancer Neg Hx    Colon polyps Neg Hx    Esophageal cancer Neg Hx    Rectal cancer Neg Hx    Stomach cancer Neg Hx    Breast cancer Neg Hx    Social History   Socioeconomic History   Marital status: Single    Spouse name: Not on file   Number of children: Not on file   Years of education: Not on file   Highest education level: Not on file  Occupational History   Not on file  Tobacco Use   Smoking status: Never   Smokeless tobacco: Never  Vaping Use   Vaping status: Never Used  Substance and Sexual Activity   Alcohol use: Never   Drug use: Never   Sexual activity: Yes    Birth control/protection: Surgical  Other Topics Concern   Not on file  Social History Narrative   Right handed   Two story home   Drinks caffeine   Social Drivers of Health   Tobacco Use: Low Risk (09/05/2024)   Patient History  Smoking Tobacco Use: Never    Smokeless Tobacco Use: Never    Passive Exposure: Not on file  Financial Resource Strain: Not on File (12/31/2021)   Received from [employer]    Financial Resource Strain: 0  Food Insecurity: Not at Risk (08/23/2024)   Received from [facility] Insecurity    Within the past 12 months, the food you bought just didn't last and you didn't have enough money to get more.: 1  Transportation Needs: Not at Risk (08/23/2024)   Received from [employer] Needs    In the past 12 months, has lack of transportation kept you from medical appointments, meetings, work or from getting things needed for daily living? (Check all that apply): 1  Physical Activity: Not on File (12/31/2021)   Received from [facility]   Physical Activity    Physical Activity: 0  Stress: Not on File (12/31/2021)   Received  from [facility]   Stress    Stress: 0  Social Connections: Not on File (05/23/2023)   Received from [facility]   Social Connections    Connectedness: 0  Intimate Partner Violence: Not on file  Depression (PHQ2-9): Not on file  Alcohol Screen: Not on file  Housing: Not on file  Utilities: Not on file  Health Literacy: Not on file            [1] No Known Allergies    Rolinda Rogue, MD 09/08/24 [phone]  "

## 2024-09-23 ENCOUNTER — Ambulatory Visit (HOSPITAL_COMMUNITY)
Admission: EM | Admit: 2024-09-23 | Discharge: 2024-09-23 | Disposition: A | Discharge status: Home / Self Care | Attending: Physician Assistant | Admitting: Physician Assistant

## 2024-09-23 ENCOUNTER — Encounter (HOSPITAL_COMMUNITY): Payer: Self-pay

## 2024-09-23 DIAGNOSIS — N3001 Acute cystitis with hematuria: Secondary | ICD-10-CM | POA: Diagnosis not present

## 2024-09-23 LAB — POCT URINE DIPSTICK
Bilirubin, UA: NEGATIVE
Glucose, UA: NEGATIVE mg/dL
Ketones, POC UA: NEGATIVE mg/dL
Nitrite, UA: NEGATIVE
POC PROTEIN,UA: NEGATIVE
Spec Grav, UA: <=1.005 — AB
Urobilinogen, UA: 0.2 U/dL
pH, UA: 5.5

## 2024-09-23 MED ORDER — PHENAZOPYRIDINE HCL 200 MG PO TABS: 200.0000 mg | ORAL_TABLET | Freq: Three times a day (TID) | ORAL | 0 refills | Status: AC

## 2024-09-23 MED ORDER — NITROFURANTOIN MONOHYD MACRO 100 MG PO CAPS: 100.0000 mg | ORAL_CAPSULE | Freq: Two times a day (BID) | ORAL | 0 refills | Status: AC

## 2024-09-23 NOTE — ED Provider Notes (Signed)
 " [facility]    CSN: [id number] Arrival date & time: 09/23/24  9073      History   Chief Complaint Chief Complaint  Patient presents with   Dysuria    HPI Joslyn Ramos is a 54 y.o. female.   Patient presents today with a 12-hour history of UTI symptoms.  She reports lower abdominal discomfort with menstruation, hematuria, dysuria, frequency.  She denies any pelvic pain, abnormal discharge, fever, nausea, vomiting.  Denies any recent urogenital procedure or self-catheterization.  Denies history of nephrolithiasis.  Denies history of diabetes and does not take an SGLT2 inhibitor.  She has been taking Azo with her last dose early this morning with temporary improvement of symptoms.  She was treated for BV with metronidazole  last month but denies additional antibiotics in the past 90 days.  She has had a tubal ligation and has no concern for pregnancy.  She reports difficulty sleeping last night because of her symptoms.    Past Medical History:  Diagnosis Date   Allergy    COVID-19 virus infection    07-2019   Hyperlipidemia    was on Simvastatin- but off currently    Medical history non-contributory    Rash     Patient Active Problem List   Diagnosis Date Noted   Lab test positive for detection of COVID-19 virus 12/09/2019   Vulvar itching 01/16/2018    Past Surgical History:  Procedure Laterality Date   CESAREAN SECTION     x2-2001,2003   OOPHORECTOMY     L ovary   TUBAL LIGATION      OB History     Gravida  2   Para  2   Term  2   Preterm  0   AB  0   Living  2      SAB  0   IAB  0   Ectopic  0   Multiple  0   Live Births  2            Home Medications    Prior to Admission medications  Medication Sig Start Date End Date Taking? Authorizing Provider  nitrofurantoin , macrocrystal-monohydrate, (MACROBID ) 100 MG capsule Take 1 capsule (100 mg total) by mouth 2 (two) times daily. 09/23/24  Yes Mollie Rossano K, PA-C   phenazopyridine  (PYRIDIUM ) 200 MG tablet Take 1 tablet (200 mg total) by mouth 3 (three) times daily. 09/23/24  Yes Giancarlo Askren, Rocky POUR, PA-C  Ascorbic Acid (VITAMIN C) 1000 MG tablet Take 1,000 mg by mouth daily.    [provider]  calcium-vitamin D (OSCAL WITH D) 250-125 MG-UNIT tablet Take 1 tablet by mouth daily.    [provider]  chlorhexidine  (PERIDEX ) 0.12 % solution Use as directed 15 mLs in the mouth or throat 2 (two) times daily. Rinse with 15 mL after meals and at bedtime 11/15/22   Iola Lukes, FNP  cholecalciferol (VITAMIN D3) 25 MCG (1000 UNIT) tablet Take 1,000 Units by mouth daily.    [provider]  diclofenac  (VOLTAREN ) 75 MG EC tablet Take 1 tablet (75 mg total) by mouth 2 (two) times daily as needed. 01/11/23   Jule Ronal CROME, PA-C  DUPIXENT 300 MG/2ML SOPN Inject into the skin. 09/09/20   [provider]  EPINEPHrine  (EPIPEN  2-PAK) 0.3 mg/0.3 mL IJ SOAJ injection Inject 0.3 mLs (0.3 mg total) into the muscle as needed for anaphylaxis. 09/17/19   Joy, Shawn C, PA-C  ezetimibe (ZETIA) 10 MG tablet Take 10  mg by mouth daily. 04/27/22   [provider]  HYDROcodone -acetaminophen  (NORCO/VICODIN) 5-325 MG tablet Take 1 tablet by mouth every 4 (four) hours as needed for severe pain. 11/15/22   Murrill, Samantha, FNP  ibuprofen  (ADVIL ) 800 MG tablet Take 1 tablet (800 mg total) by mouth every 8 (eight) hours as needed for moderate pain or mild pain. 11/15/22   Murrill, Samantha, FNP  meclizine  (ANTIVERT ) 25 MG tablet Take 1 tablet (25 mg total) by mouth 3 (three) times daily as needed for dizziness. 05/05/22   Arloa Suzen RAMAN, NP  metroNIDAZOLE  (FLAGYL ) 500 MG tablet Take 1 tablet (500 mg total) by mouth 2 (two) times daily. 09/05/24   Rolinda Rogue, MD  Multiple Vitamins-Minerals (HAIR SKIN AND NAILS FORMULA PO) Take by mouth.    [provider]  Multiple Vitamins-Minerals (MULTIVITAMIN WOMENS 50+ ADV PO) Take by mouth.    [provider]    Family History Family History  Problem Relation Age of Onset   Hypercholesterolemia Mother    Liver cancer Father    Colon cancer Neg Hx    Colon polyps Neg Hx    Esophageal cancer Neg Hx    Rectal cancer Neg Hx    Stomach cancer Neg Hx    Breast cancer Neg Hx     Social History Social History[1]   Allergies   Patient has no known allergies.   Review of Systems Review of Systems  Constitutional:  Positive for activity change. Negative for appetite change, fatigue and fever.  Gastrointestinal:  Negative for abdominal pain, diarrhea, nausea and vomiting.  Genitourinary:  Positive for dysuria, frequency, hematuria and urgency. Negative for flank pain, vaginal bleeding, vaginal discharge and vaginal pain.     Physical Exam Triage Vital Signs ED Triage Vitals  Encounter Vitals Group     BP 09/23/24 1126 101/68     Girls Systolic BP Percentile --      Girls Diastolic BP Percentile --      Boys Systolic BP Percentile --      Boys Diastolic BP Percentile --      Pulse Rate 09/23/24 1126 69     Resp 09/23/24 1126 16     Temp 09/23/24 1126 98 F (36.7 C)     Temp Source 09/23/24 1126 Oral     SpO2 09/23/24 1126 96 %     Weight --      Height --      Head Circumference --      Peak Flow --      Pain Score 09/23/24 1125 10     Pain Loc --      Pain Education --      Exclude from Growth Chart --    No data found.  Updated Vital Signs BP 101/68 (BP Location: Right Arm)   Pulse 69   Temp 98 F (36.7 C) (Oral)   Resp 16   SpO2 96%   Visual Acuity Right Eye Distance:   Left Eye Distance:   Bilateral Distance:    Right Eye Near:   Left Eye Near:    Bilateral Near:     Physical Exam Vitals reviewed.  Constitutional:      General: She is awake. She is not in acute distress.    Appearance: Normal appearance. She is well-developed. She is not ill-appearing.     Comments: Very pleasant female appears stated age in no acute distress sitting  comfortably in exam room  HENT:  Head: Normocephalic and atraumatic.  Cardiovascular:     Rate and Rhythm: Normal rate and regular rhythm.     Heart sounds: Normal heart sounds, S1 normal and S2 normal. No murmur heard. Pulmonary:     Effort: Pulmonary effort is normal.     Breath sounds: Normal breath sounds. No wheezing, rhonchi or rales.     Comments: Clear to auscultation bilaterally Abdominal:     General: Bowel sounds are normal.     Palpations: Abdomen is soft.     Tenderness: There is no abdominal tenderness. There is no right CVA tenderness, left CVA tenderness, guarding or rebound.     Comments: Benign abdominal exam.  No CVA tenderness.  Psychiatric:        Behavior: Behavior is cooperative.      UC Treatments / Results  Labs (all labs ordered are listed, but only abnormal results are displayed) Labs Reviewed  POCT URINE DIPSTICK - Abnormal; Notable for the following components:      Result Value   Spec Grav, UA <=1.005 (*)    Blood, UA small (*)    Leukocytes, UA Trace (*)    All other components within normal limits  URINE CULTURE    EKG   Radiology No results found.  Procedures Procedures (including critical care time)  Medications Ordered in UC Medications - No data to display  Initial Impression / Assessment and Plan / UC Course  I have reviewed the triage vital signs and the nursing notes.  Pertinent labs & imaging results that were available during my care of the patient were reviewed by me and considered in my medical decision making (see chart for details).     Patient is well-appearing, afebrile, nontoxic, nontachycardic.  Vital signs and physical exam are reassuring with no indication for emergent evaluation or imaging.  UA concerning for UTI so we will start Macrobid  twice daily for 5 days.  No indication for dose adjustment based on metabolic panel from 08/23/2024 with creatinine of 0.64 calculated creatinine clearance of 85 mL/min.   Will send this for culture and contact her if we need to discontinue or change her antibiotics based on culture results.  Recommend that she rest and drink plenty of fluid.  She was given Pyridium  to help with dysuria but discussed that this replaces Azo.  She did have blood noted on UA discussed that she should follow-up with her PCP within a few weeks to have her urine rechecked and ensure that this goes away with clearing of infection.  She denied any significant vaginal discharge or pelvic pain so cervical vaginal swab was deferred.  Discussed that if anything changes or worsens and she has a severe abdominal pain, fever, nausea, vomiting, weakness, persistent or worsening urinary symptoms she needs to be seen immediately.  Strict return precautions given.  Excuse note provided.   Final Clinical Impressions(s) / UC Diagnoses   Final diagnoses:  Acute cystitis with hematuria     Discharge Instructions      We are treating you for urinary tract infection.  Please start Macrobid  twice daily for 5 days.  Make sure you rest and drink plenty of fluid.  I will contact you if need to stop or change your antibiotics based on your culture results. There was some blood noted in your urinalysis and so I recommend that you follow-up with your primary care in 2 to 4 weeks to have this repeated and ensure that it goes away once we have treated  the infection. Take Pyridium  up to 3 times a day instead of Azo to help with pain.  If anything worsens and you have abdominal pain, blood in your urine, nausea, vomiting, fever, weakness, persistent or worsening symptoms you should be seen immediately.       ED Prescriptions     Medication Sig Dispense Auth. Provider   nitrofurantoin , macrocrystal-monohydrate, (MACROBID ) 100 MG capsule Take 1 capsule (100 mg total) by mouth 2 (two) times daily. 10 capsule Laramie Meissner K, PA-C   phenazopyridine  (PYRIDIUM ) 200 MG tablet Take 1 tablet (200 mg total) by mouth 3  (three) times daily. 6 tablet Maricia Scotti K, PA-C      PDMP not reviewed this encounter.    [1]  Social History Tobacco Use   Smoking status: Never   Smokeless tobacco: Never  Vaping Use   Vaping status: Never Used  Substance Use Topics   Alcohol use: Never   Drug use: Never     Sherrell Rocky POUR, PA-C 09/23/24 1152  "

## 2024-09-23 NOTE — Discharge Instructions (Signed)
 We are treating you for urinary tract infection.  Please start Macrobid  twice daily for 5 days.  Make sure you rest and drink plenty of fluid.  I will contact you if need to stop or change your antibiotics based on your culture results. There was some blood noted in your urinalysis and so I recommend that you follow-up with your primary care in 2 to 4 weeks to have this repeated and ensure that it goes away once we have treated the infection. Take Pyridium  up to 3 times a day instead of Azo to help with pain.  If anything worsens and you have abdominal pain, blood in your urine, nausea, vomiting, fever, weakness, persistent or worsening symptoms you should be seen immediately.

## 2024-09-23 NOTE — ED Triage Notes (Signed)
 Having urinary frequency, burning urination, and blood with wiping. Onset last night.  Patient tried Azo with mild relief.

## 2024-09-24 ENCOUNTER — Ambulatory Visit (HOSPITAL_COMMUNITY): Payer: Self-pay

## 2024-09-24 LAB — URINE CULTURE: Culture: NO GROWTH

## 2024-10-09 ENCOUNTER — Other Ambulatory Visit: Payer: Self-pay

## 2024-10-10 ENCOUNTER — Encounter: Payer: Self-pay | Admitting: Obstetrics & Gynecology

## 2024-10-10 ENCOUNTER — Ambulatory Visit: Payer: Self-pay | Admitting: Obstetrics & Gynecology

## 2024-10-10 VITALS — BP 117/80 | HR 76 | Ht 60.0 in | Wt 118.7 lb

## 2024-10-10 DIAGNOSIS — R87610 Atypical squamous cells of undetermined significance on cytologic smear of cervix (ASC-US): Secondary | ICD-10-CM | POA: Diagnosis not present

## 2024-10-10 DIAGNOSIS — R87810 Cervical high risk human papillomavirus (HPV) DNA test positive: Secondary | ICD-10-CM | POA: Diagnosis not present

## 2024-10-10 NOTE — Progress Notes (Signed)
 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 Referred by PCP.  Would like to discuss next steps.   Irregular cycle.   Takes shots (dupixent) every 2 weeks for skin flair-ups. Unsure of cause.

## 2024-10-10 NOTE — Progress Notes (Signed)
 Patient ID: Elaine Hawkins, female   DOB: September 30, 1970, 54 y.o.   MRN: [id number]  CC: referred due to abnl pap 08/2024   HPI Elaine Hawkins is a 54 y.o. female.  [id number] Referred by PCP. Colposcopy was not scheduled HPI  Indications: Pap smear on December 2025 showed: ASCUS with POSITIVE high risk HPV. Previous colposcopy: no. Prior cervical treatment: no treatment.  Past Medical History:  Diagnosis Date   Allergy    COVID-19 virus infection    07-2019   Hyperlipidemia    was on Simvastatin- but off currently    Medical history non-contributory    Rash     Past Surgical History:  Procedure Laterality Date   CESAREAN SECTION     x2-2001,2003   OOPHORECTOMY     L ovary   TUBAL LIGATION      Family History  Problem Relation Age of Onset   Hypercholesterolemia Mother    Liver cancer Father    Colon cancer Neg Hx    Colon polyps Neg Hx    Esophageal cancer Neg Hx    Rectal cancer Neg Hx    Stomach cancer Neg Hx    Breast cancer Neg Hx     Social History Social History[1]  Allergies[2]  Current Outpatient Medications  Medication Sig Dispense Refill   Ascorbic Acid (VITAMIN C) 1000 MG tablet Take 1,000 mg by mouth daily.     calcium-vitamin D (OSCAL WITH D) 250-125 MG-UNIT tablet Take 1 tablet by mouth daily.     cholecalciferol (VITAMIN D3) 25 MCG (1000 UNIT) tablet Take 1,000 Units by mouth daily.     diclofenac  (VOLTAREN ) 75 MG EC tablet Take 1 tablet (75 mg total) by mouth 2 (two) times daily as needed. 60 tablet 2   DUPIXENT 300 MG/2ML SOPN Inject into the skin.     EPINEPHrine  (EPIPEN  2-PAK) 0.3 mg/0.3 mL IJ SOAJ injection Inject 0.3 mLs (0.3 mg total) into the muscle as needed for anaphylaxis. 1 each 1   ezetimibe (ZETIA) 10 MG tablet Take 10 mg by mouth daily.     HYDROcodone -acetaminophen  (NORCO/VICODIN) 5-325 MG tablet Take 1 tablet by mouth every 4 (four) hours as needed for severe pain. 10 tablet 0   meclizine  (ANTIVERT ) 25 MG tablet Take 1 tablet (25  mg total) by mouth 3 (three) times daily as needed for dizziness. 30 tablet 0   Multiple Vitamins-Minerals (HAIR SKIN AND NAILS FORMULA PO) Take by mouth.     Multiple Vitamins-Minerals (MULTIVITAMIN WOMENS 50+ ADV PO) Take by mouth.     phenazopyridine  (PYRIDIUM ) 200 MG tablet Take 1 tablet (200 mg total) by mouth 3 (three) times daily. 6 tablet 0   chlorhexidine  (PERIDEX ) 0.12 % solution Use as directed 15 mLs in the mouth or throat 2 (two) times daily. Rinse with 15 mL after meals and at bedtime (Patient not taking: Reported on 10/10/2024) 120 mL 0   ibuprofen  (ADVIL ) 800 MG tablet Take 1 tablet (800 mg total) by mouth every 8 (eight) hours as needed for moderate pain or mild pain. (Patient not taking: Reported on 10/10/2024) 21 tablet 0   metroNIDAZOLE  (FLAGYL ) 500 MG tablet Take 1 tablet (500 mg total) by mouth 2 (two) times daily. (Patient not taking: Reported on 10/10/2024) 14 tablet 0   nitrofurantoin , macrocrystal-monohydrate, (MACROBID ) 100 MG capsule Take 1 capsule (100 mg total) by mouth 2 (two) times daily. (Patient not taking: Reported on 10/10/2024) 10 capsule 0   No current facility-administered medications for this  visit.    Review of Systems Review of Systems  Genitourinary:  Positive for menstrual problem (irregular periods).    Blood pressure 117/80, pulse 76, height 5' (1.524 m), weight 118 lb 11.2 oz (53.8 kg).  Physical Exam Physical Exam Vitals and nursing note reviewed.  Constitutional:      Appearance: Normal appearance.  Pulmonary:     Effort: Pulmonary effort is normal.  Neurological:     General: No focal deficit present.     Mental Status: She is alert.  Psychiatric:        Mood and Affect: Mood normal.        Behavior: Behavior normal.     Data Reviewed Pap 08/23/24  Assessment    ASCUS pos HR HPV    Plan    She elects to schedule colposcopy on another day due to time constraints Questions were answered  Information was provided re: procedure  including need for biopsies      Lynwood Solomons 10/10/2024, 3:41 PM     [1]  Social History Tobacco Use   Smoking status: Never   Smokeless tobacco: Never  Vaping Use   Vaping status: Never Used  Substance Use Topics   Alcohol use: Never   Drug use: Never  [2] No Known Allergies
